# Patient Record
Sex: Male | Born: 1975 | Hispanic: No | Marital: Married | Smoking: Never smoker
Health system: Southern US, Community
[De-identification: ages and names within clinical notes are randomized; demographics above are authoritative.]

## PROBLEM LIST (undated history)

## (undated) DIAGNOSIS — M5136 Other intervertebral disc degeneration, lumbar region: Secondary | ICD-10-CM

## (undated) DIAGNOSIS — M51369 Other intervertebral disc degeneration, lumbar region without mention of lumbar back pain or lower extremity pain: Secondary | ICD-10-CM

## (undated) HISTORY — DX: Other intervertebral disc degeneration, lumbar region: M51.36

## (undated) HISTORY — DX: Other intervertebral disc degeneration, lumbar region without mention of lumbar back pain or lower extremity pain: M51.369

## (undated) HISTORY — PX: TONSILLECTOMY: SUR1361

## (undated) HISTORY — PX: KNEE ARTHROSCOPY: SHX127

---
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
## 2005-07-02 ENCOUNTER — Emergency Department (HOSPITAL_COMMUNITY): Admission: EM | Admit: 2005-07-02 | Discharge: 2005-07-02 | Payer: Self-pay | Admitting: Emergency Medicine

## 2008-05-21 ENCOUNTER — Emergency Department (HOSPITAL_COMMUNITY): Admission: EM | Admit: 2008-05-21 | Discharge: 2008-05-21 | Payer: Self-pay | Admitting: Emergency Medicine

## 2010-07-24 ENCOUNTER — Ambulatory Visit: Admit: 2010-07-24 | Payer: Self-pay | Admitting: Internal Medicine

## 2010-09-07 ENCOUNTER — Ambulatory Visit (INDEPENDENT_AMBULATORY_CARE_PROVIDER_SITE_OTHER): Payer: 59 | Admitting: Internal Medicine

## 2010-09-07 ENCOUNTER — Other Ambulatory Visit: Payer: 59

## 2010-09-07 ENCOUNTER — Encounter: Payer: Self-pay | Admitting: Internal Medicine

## 2010-09-07 ENCOUNTER — Other Ambulatory Visit: Payer: Self-pay | Admitting: Internal Medicine

## 2010-09-07 DIAGNOSIS — M5137 Other intervertebral disc degeneration, lumbosacral region: Secondary | ICD-10-CM | POA: Insufficient documentation

## 2010-09-07 DIAGNOSIS — Z0000 Encounter for general adult medical examination without abnormal findings: Secondary | ICD-10-CM

## 2010-09-07 DIAGNOSIS — E785 Hyperlipidemia, unspecified: Secondary | ICD-10-CM

## 2010-09-07 LAB — HEPATIC FUNCTION PANEL
ALT: 23 U/L (ref 0–53)
Total Bilirubin: 0.5 mg/dL (ref 0.3–1.2)
Total Protein: 7.6 g/dL (ref 6.0–8.3)

## 2010-09-07 LAB — BASIC METABOLIC PANEL
BUN: 14 mg/dL (ref 6–23)
CO2: 28 mEq/L (ref 19–32)
Chloride: 101 mEq/L (ref 96–112)
Creatinine, Ser: 0.8 mg/dL (ref 0.4–1.5)
Glucose, Bld: 85 mg/dL (ref 70–99)
Potassium: 4.3 mEq/L (ref 3.5–5.1)

## 2010-09-07 LAB — CBC WITH DIFFERENTIAL/PLATELET
Eosinophils Absolute: 0.1 10*3/uL (ref 0.0–0.7)
Eosinophils Relative: 1.6 % (ref 0.0–5.0)
HCT: 43.7 % (ref 39.0–52.0)
Lymphs Abs: 2.6 10*3/uL (ref 0.7–4.0)
MCHC: 35.2 g/dL (ref 30.0–36.0)
MCV: 88.9 fl (ref 78.0–100.0)
Monocytes Absolute: 0.8 10*3/uL (ref 0.1–1.0)
Platelets: 308 10*3/uL (ref 150.0–400.0)
WBC: 7.5 10*3/uL (ref 4.5–10.5)

## 2010-09-07 LAB — LDL CHOLESTEROL, DIRECT: Direct LDL: 102.8 mg/dL

## 2010-09-07 LAB — LIPID PANEL
Cholesterol: 178 mg/dL (ref 0–200)
VLDL: 57.4 mg/dL — ABNORMAL HIGH (ref 0.0–40.0)
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
## 2011-10-18 ENCOUNTER — Encounter: Payer: Self-pay | Admitting: Internal Medicine

## 2011-10-18 ENCOUNTER — Encounter: Payer: Self-pay | Admitting: *Deleted

## 2011-10-18 ENCOUNTER — Ambulatory Visit (INDEPENDENT_AMBULATORY_CARE_PROVIDER_SITE_OTHER): Payer: 59 | Admitting: Internal Medicine

## 2011-10-18 VITALS — BP 100/68 | HR 81 | Temp 97.1°F | Resp 16 | Wt 221.0 lb

## 2011-10-18 DIAGNOSIS — IMO0002 Reserved for concepts with insufficient information to code with codable children: Secondary | ICD-10-CM

## 2011-10-18 DIAGNOSIS — M5416 Radiculopathy, lumbar region: Secondary | ICD-10-CM

## 2011-10-18 MED ORDER — HYDROCODONE-ACETAMINOPHEN 5-500 MG PO TABS: 1.0000 | ORAL_TABLET | Freq: Three times a day (TID) | ORAL | Status: AC | PRN

## 2011-10-18 MED ORDER — PREDNISONE (PAK) 10 MG PO TABS: 10.0000 mg | ORAL_TABLET | ORAL | Status: AC
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
## 2011-10-18 NOTE — Patient Instructions (Signed)
It was good to see you today. Pred taper x 12days for treatment of inflammation around irritated disc and nerve pain - also hydrocodone if needed for severe pain spasm - Your prescription(s) have been submitted to your pharmacy. Please take as directed and contact our office if you believe you are having problem(s) with the medication(s). Continue your back exercises for physical therapy once pain symptoms have improved and ok to use tens unit as previously directed If pain symptoms worse, or if unimproved with treatment in next 2-4 weeks, call for reevaluation as discussed. Can refer to local neurosurgeon if needed

## 2011-11-09 ENCOUNTER — Encounter: Payer: Self-pay | Admitting: Internal Medicine

## 2011-11-09 ENCOUNTER — Ambulatory Visit (INDEPENDENT_AMBULATORY_CARE_PROVIDER_SITE_OTHER): Payer: 59 | Admitting: Internal Medicine

## 2011-11-09 VITALS — BP 104/60 | HR 93 | Temp 97.2°F | Resp 16 | Wt 228.0 lb

## 2011-11-09 DIAGNOSIS — M5137 Other intervertebral disc degeneration, lumbosacral region: Secondary | ICD-10-CM
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
## 2011-11-09 NOTE — Assessment & Plan Note (Signed)
Patient with several weeks of back pain that failed to improve with steroids. He has had LS spine films with loss of disc height L5-S1. He has had difficulty with urination over the past week along with acceleration in his pain. Exam is positive for radiculopathy. High degree of probability he has HNP with significant nerve compression with progressive radiculopathy.  Plan - STAT MRI lumbar spine           Recommendations based on results - looks like he may need urgent NS consult.

## 2011-11-10 ENCOUNTER — Other Ambulatory Visit: Payer: 59

## 2011-12-15 ENCOUNTER — Encounter: Payer: Self-pay | Admitting: Internal Medicine

## 2011-12-15 ENCOUNTER — Ambulatory Visit (INDEPENDENT_AMBULATORY_CARE_PROVIDER_SITE_OTHER): Payer: 59 | Admitting: Internal Medicine

## 2011-12-15 VITALS — BP 104/72 | HR 88 | Temp 98.1°F | Resp 16 | Ht 74.0 in | Wt 223.0 lb

## 2011-12-15 DIAGNOSIS — M5137 Other intervertebral disc degeneration, lumbosacral region: Secondary | ICD-10-CM

## 2011-12-15 DIAGNOSIS — Z0000 Encounter for general adult medical examination without abnormal findings: Secondary | ICD-10-CM

## 2011-12-18 DIAGNOSIS — Z0000 Encounter for general adult medical examination without abnormal findings: Secondary | ICD-10-CM | POA: Insufficient documentation

## 2011-12-18 NOTE — Assessment & Plan Note (Signed)
Mr. Frank [name] was sent by his employer to Physical therapy. He has done very well with resolution of his back pain.  Plan Continue to do regular back exercises to reduce risk of recurrent back pain.

## 2011-12-18 NOTE — Assessment & Plan Note (Signed)
Interval medical history significant for episode of backpain that has improved with PT. He has otherwise been healthy. Physical exam is normal. Labs from march '12 reviewed and except for triglycerides were normal. He is advised that there is no need for repeating labs at this interval: general labs every 3-5 years is adequate for screening. There is no general screening lab test for "cancer." Discussed ideal body weight and that he is mildly overweight with a BMI of 28.6. He is advised to continue with smart food choices, portion size control and regular exercise with a target of getting his BMI to 25.  In summary - a very nice man who is medically stable. He is encouraged to continue to make an active investment in his health: good diet, rest and plenty of exercise.  He will return in 2 -3 years for repeat general exam. He will return sooner as needed for any acute medical problems.

## 2011-12-18 NOTE — Progress Notes (Signed)
Subjective:    Patient ID: Frank [name], male    DOB: June 19, 1976, 36 y.o.   MRN: [id number]  HPI Frank [name] presents for general medical wellness exam. He has been actively investing in his health: watching his diet, trying to exercise and get adequate rest. The nature of his job makes this a challenge. He is very concerned about his lab parameters especially his cholesterol levels. He is feeling well and has no specific complaints. He has no limitations in his daily activities.  Past Medical History  Diagnosis Date  . DDD (degenerative disc disease), lumbar    Past Surgical History  Procedure Date  . Knee arthroscopy     at [place]   Family History  Problem Relation Age of Onset  . Hypertension Mother   . Depression Mother   . Cancer Maternal Grandmother     colon  . Cancer Maternal Grandfather     throat  . Diabetes Neg Hx    History   Social History  . Marital Status: Married    Spouse Name: N/A    Number of Children: N/A  . Years of Education: N/A   Occupational History  . Not on file.   Social History Main Topics  . Smoking status: Never Smoker   . Smokeless tobacco: Never Used  . Alcohol Use: No  . Drug Use: No  . Sexually Active: Yes -- Male partner(s)   Other Topics Concern  . Not on file   Social History Narrative   [employer]- BS Lobbyist. Played soccer, volleyball. Married - 2006. 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 Marriage is in good health.       Review of Systems Constitutional:  Negative for fever, chills, activity change and unexpected weight change.  HEENT:  Negative for hearing loss, ear pain, congestion, neck stiffness and postnasal drip. Negative for sore throat or swallowing problems. Negative for dental complaints.   Eyes: Negative for vision loss or change in visual acuity.  Respiratory: Negative for chest tightness and wheezing. Negative for DOE.   Cardiovascular: Negative for  chest pain or palpitations. No decreased exercise tolerance Gastrointestinal: No change in bowel habit. No bloating or gas. No reflux or indigestion Genitourinary: Negative for urgency, frequency, flank pain and difficulty urinating.  Musculoskeletal: Negative for myalgias, back pain, arthralgias and gait problem.  Neurological: Negative for dizziness, tremors, weakness and headaches.  Hematological: Negative for adenopathy.  Psychiatric/Behavioral: Negative for behavioral problems and dysphoric mood.       Objective:   Physical Exam Filed Vitals:   12/15/11 1454  BP: 104/72  Pulse: 88  Temp: 98.1 F (36.7 C)  Resp: 16  Weight: 223 lb (101.152 kg)  BMI 28.6 ( nl 19-25) Gen'l: Well nourished well developed male in no acute distress  HEENT: Head: Normocephalic and atraumatic. Right Ear: External ear normal. EAC/TM nl. Left Ear: External ear normal.  EAC/TM nl. Nose: Nose normal. Mouth/Throat: Oropharynx is clear and moist. Dentition - native, in good repair. No buccal or palatal lesions. Posterior pharynx clear. Eyes: Conjunctivae and sclera clear. EOM intact. Pupils are equal, round, and reactive to light. Right eye exhibits no discharge. Left eye exhibits no discharge. Neck: Normal range of motion. Neck supple. No JVD present. No tracheal deviation present. No thyromegaly present.  Cardiovascular: Normal rate, regular rhythm, no gallop, no friction rub, no murmur heard.      Quiet precordium. 2+ radial and DP pulses . No carotid bruits Pulmonary/Chest:  Effort normal. No respiratory distress or increased WOB, no wheezes, no rales. No chest wall deformity or CVAT. Abdominal: Soft. Bowel sounds are normal in all quadrants. He exhibits no distension, no tenderness, no rebound or guarding, No heptosplenomegaly  Genitourinary:  deferred Musculoskeletal: Normal range of motion. He exhibits no edema and no tenderness.       Small and large joints without redness, synovial thickening or  deformity. Full range of motion preserved about all small, median and large joints.  Lymphadenopathy:    He has no cervical or supraclavicular adenopathy.  Neurological: He is alert and oriented to person, place, and time. CN II-XII intact. DTRs 2+ and symmetrical biceps, radial and patellar tendons. Cerebellar function normal with no tremor, rigidity, normal gait and station.  Skin: Skin is warm and dry. No rash noted. No erythema.  Psychiatric: He has a normal mood and affect. His behavior is normal. Thought content normal.   Lab Results  Component Value Date   WBC 7.5 09/07/2010   HGB 15.4 09/07/2010   HCT 43.7 09/07/2010   PLT 308.0 09/07/2010   GLUCOSE 85 09/07/2010   [name] 178 09/07/2010   TRIG 287.0* 09/07/2010   HDL 29.70* 09/07/2010   LDLDIRECT 102.8 09/07/2010   ALT 23 09/07/2010   AST 23 09/07/2010   NA 135 09/07/2010   K 4.3 09/07/2010   CL 101 09/07/2010   CREATININE 0.8 09/07/2010   BUN 14 09/07/2010   CO2 28 09/07/2010            Assessment & Plan:

## 2013-03-28 ENCOUNTER — Encounter: Payer: 59 | Admitting: Internal Medicine

## 2013-05-22 ENCOUNTER — Encounter: Payer: Self-pay | Admitting: Internal Medicine

## 2013-05-22 ENCOUNTER — Ambulatory Visit (INDEPENDENT_AMBULATORY_CARE_PROVIDER_SITE_OTHER): Payer: 59 | Admitting: Internal Medicine

## 2013-05-22 VITALS — BP 120/70 | HR 84 | Temp 98.6°F | Ht 74.0 in | Wt 238.4 lb

## 2013-05-22 DIAGNOSIS — F411 Generalized anxiety disorder: Secondary | ICD-10-CM

## 2013-05-22 DIAGNOSIS — Z23 Encounter for immunization: Secondary | ICD-10-CM

## 2013-05-22 DIAGNOSIS — M76899 Other specified enthesopathies of unspecified lower limb, excluding foot: Secondary | ICD-10-CM

## 2013-05-22 DIAGNOSIS — M7062 Trochanteric bursitis, left hip: Secondary | ICD-10-CM

## 2013-05-22 DIAGNOSIS — M5137 Other intervertebral disc degeneration, lumbosacral region: Secondary | ICD-10-CM

## 2013-05-22 DIAGNOSIS — Z0000 Encounter for general adult medical examination without abnormal findings: Secondary | ICD-10-CM

## 2013-05-22 NOTE — Patient Instructions (Signed)
Thanks for coming in to see me.  Over all you appear to be very healthy.  Pain at the left hip - good Range of motion. There is tenderness to pressure against the greater trochanter - this is strongly suggestive of trochanteric bursitis (see below). Plan Rub of choice (BenGay, Aspercreme, etc)_, heat and short term use of over the counter anti-inflammatory medication, e.g. Aleve or Ibuprofen.  Stress and relationship - it is stressful to have 3 children for both of you. I strongly recommended couples counseling/couples work. Google "Marriage Encounter" as a possible resource, check out the availability of faith based counseling or consider [facility] for counseling - [phone] to call for an appointment with one of our staff. Marriage takes a lot of work but is definitely work it.  Routine labs today - results will be posted to MyChart.  You should have a repeat general exam in 2-3 years but may return sooner for problems.    Trochanteric Bursitis You have hip pain due to trochanteric bursitis. Bursitis means that the sack near the outside of the hip is filled with fluid and inflamed. This sack is made up of protective soft tissue. The pain from trochanteric bursitis can be severe and keep you from sleep. It can radiate to the buttocks or down the outside of the thigh to the knee. The pain is almost always worse when rising from the seated or lying position and with walking. Pain can improve after you take a few steps. It happens more often in people with hip joint and lumbar spine problems, such as arthritis or previous surgery. Very rarely the trochanteric bursa can become infected, and antibiotics and/or surgery may be needed. Treatment often includes an injection of local anesthetic mixed with cortisone medicine. This medicine is injected into the area where it is most tender over the hip. Repeat injections may be necessary if the response to treatment is slow. You can apply ice  packs over the tender area for 30 minutes every 2 hours for the next few days. Anti-inflammatory and/or narcotic pain medicine may also be helpful. Limit your activity for the next few days if the pain continues. See your caregiver in 5-10 days if you are not greatly improved.  SEEK IMMEDIATE MEDICAL CARE IF:  You develop severe pain, fever, or increased redness.  You have pain that radiates below the knee. EXERCISES STRETCHING EXERCISES - Trochantic Bursitis  These exercises may help you when beginning to rehabilitate your injury. Your symptoms may resolve with or without further involvement from your physician, physical therapist or athletic trainer. While completing these exercises, remember:   Restoring tissue flexibility helps normal motion to return to the joints. This allows healthier, less painful movement and activity.  An effective stretch should be held for at least 30 seconds.  A stretch should never be painful. You should only feel a gentle lengthening or release in the stretched tissue. STRETCH  Iliotibial Band  On the floor or bed, lie on your side so your injured leg is on top. Bend your knee and grab your ankle.  Slowly bring your knee back so that your thigh is in line with your trunk. Keep your heel at your buttocks and gently arch your back so your head, shoulders and hips line up.  Slowly lower your leg so that your knee approaches the floor/bed until you feel a gentle stretch on the outside of your thigh. If you do not feel a stretch and your knee will not  fall farther, place the heel of your opposite foot on top of your knee and pull your thigh down farther.  Hold this stretch for __________ seconds.  Repeat __________ times. Complete this exercise __________ times per day. STRETCH Hamstrings, Supine   Lie on your back. Loop a belt or towel over the ball of your foot as shown.  Straighten your knee and slowly pull on the belt to raise your injured leg. Do not  allow the knee to bend. Keep your opposite leg flat on the floor.  Raise the leg until you feel a gentle stretch behind your knee or thigh. Hold this position for __________ seconds.  Repeat __________ times. Complete this stretch __________ times per day. STRETCH - Quadriceps, Prone   Lie on your stomach on a firm surface, such as a bed or padded floor.  Bend your knee and grasp your ankle. If you are unable to reach, your ankle or pant leg, use a belt around your foot to lengthen your reach.  Gently pull your heel toward your buttocks. Your knee should not slide out to the side. You should feel a stretch in the front of your thigh and/or knee.  Hold this position for __________ seconds.  Repeat __________ times. Complete this stretch __________ times per day. STRETCHING - Hip Flexors, Lunge Half kneel with your knee on the floor and your opposite knee bent and directly over your ankle.  Keep good posture with your head over your shoulders. Tighten your buttocks to point your tailbone downward; this will prevent your back from arching too much.  You should feel a gentle stretch in the front of your thigh and/or hip. If you do not feel any resistance, slightly slide your opposite foot forward and then slowly lunge forward so your knee once again lines up over your ankle. Be sure your tailbone remains pointed downward.  Hold this stretch for __________ seconds.  Repeat __________ times. Complete this stretch __________ times per day. STRETCH - Adductors, Lunge  While standing, spread your legs  Lean away from your injured leg by bending your opposite knee. You may rest your hands on your thigh for balance.  You should feel a stretch in your inner thigh. Hold for __________ seconds.  Repeat __________ times. Complete this exercise __________ times per day. Document Released: 07/29/2004 Document Revised: 09/13/2011 Document Reviewed: 10/03/2008 [facility] Patient Information 2014  [place], [place].

## 2013-05-22 NOTE — Progress Notes (Signed)
Subjective:    Patient ID: Frank [name], male    DOB: March 21, 1976, 37 y.o.   MRN: [id number]  HPI Mr. Varelas presents for a routine wellness exam. He has been doing well. He has now got 3 children under a year, 2 years and 4 years. He reports that there is some stress - he feels is wife is having a change in mood. He has had some symptoms usually after an argument.  He has had minimal back trouble. He reports a month of pain in the left thigh, trochanter area.   Past Medical History  Diagnosis Date  . DDD (degenerative disc disease), lumbar    Past Surgical History  Procedure Laterality Date  . Knee arthroscopy      at [place]   Family History  Problem Relation Age of Onset  . Hypertension Mother   . Depression Mother   . Cancer Maternal Grandmother     colon  . Cancer Maternal Grandfather     throat  . Diabetes Neg Hx    History   Social History  . Marital Status: Married    Spouse Name: N/A    Number of Children: N/A  . Years of Education: N/A   Occupational History  . Not on file.   Social History Main Topics  . Smoking status: Never Smoker   . Smokeless tobacco: Never Used  . Alcohol Use: Yes     Comment: occ  . Drug Use: No  . Sexual Activity: Yes    Partners: Female   Other Topics Concern  . Not on file   Social History Narrative   [employer]- BS Lobbyist. Played soccer, volleyball. Married - 2006. 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 Marriage is in good health.    No current outpatient prescriptions on file prior to visit.   No current facility-administered medications on file prior to visit.      Review of Systems Constitutional:  Negative for fever, chills, activity change and unexpected weight change.  HEENT:  Negative for hearing loss, ear pain, congestion, neck stiffness and postnasal drip. Negative for sore throat or swallowing problems. Negative for dental complaints.   Eyes: Negative for  vision loss or change in visual acuity.  Respiratory: Negative for chest tightness and wheezing. Negative for DOE.   Cardiovascular: Negative for chest pain or palpitations. No decreased exercise tolerance Gastrointestinal: No change in bowel habit. No bloating or gas. No reflux or indigestion Genitourinary: Negative for urgency, frequency, flank pain and difficulty urinating.  Musculoskeletal: Negative for myalgias, back pain, arthralgias and gait problem.  Neurological: Negative for dizziness, tremors, weakness and headaches.  Hematological: Negative for adenopathy.  Psychiatric/Behavioral: Negative for behavioral problems and dysphoric mood.       Objective:   Physical Exam Filed Vitals:   05/22/13 0923  BP: 120/70  Pulse: 84  Temp: 98.6 F (37 C)   Wt Readings from Last 3 Encounters:  05/22/13 238 lb 6.4 oz (108.138 kg)  12/15/11 223 lb (101.152 kg)  11/09/11 228 lb (103.42 kg)   Gen'l: Well nourished well developed     male in no acute distress  HEENT: Head: Normocephalic and atraumatic. Right Ear: External ear normal. EAC/TM nl. Left Ear: External ear normal.  EAC/TM nl. Nose: Nose normal. Mouth/Throat: Oropharynx is clear and moist. Dentition - native, in good repair. No buccal or palatal lesions. Posterior pharynx clear. Eyes: Conjunctivae and sclera clear. EOM intact. Pupils are equal, round, and  reactive to light. Right eye exhibits no discharge. Left eye exhibits no discharge. Neck: Normal range of motion. Neck supple. No JVD present. No tracheal deviation present. No thyromegaly present.  Cardiovascular: Normal rate, regular rhythm, no gallop, no friction rub, no murmur heard.      Quiet precordium. 2+ radial and DP pulses . No carotid bruits Pulmonary/Chest: Effort normal. No respiratory distress or increased WOB, no wheezes, no rales. No chest wall deformity or CVAT. Abdomen: Soft. Bowel sounds are normal in all quadrants. He exhibits no distension, no tenderness, no  rebound or guarding, No heptosplenomegaly  Genitourinary:  deferred to young age Musculoskeletal: Normal range of motion. He exhibits no edema and no tenderness.       Small and large joints without redness, synovial thickening or deformity. Full range of motion preserved about all small, median and large joints. Normal ROM left hip but tender with internal and external rotation at the lateral hip. Tender to pressure against the greater trochanter left.  Lymphadenopathy:    He has no cervical or supraclavicular adenopathy.  Neurological: He is alert and oriented to person, place, and time. CN II-XII intact. DTRs 2+ and symmetrical biceps, radial and patellar tendons. Cerebellar function normal with no tremor, rigidity, normal gait and station.  Skin: Skin is warm and dry. No rash noted. No erythema.  Psychiatric: He has a normal mood and affect. His behavior is normal. Thought content normal.           Assessment & Plan:

## 2013-05-22 NOTE — Progress Notes (Signed)
Pre visit review using our clinic review tool, if applicable. No additional management support is needed unless otherwise documented below in the visit note. 

## 2013-05-23 DIAGNOSIS — M7062 Trochanteric bursitis, left hip: Secondary | ICD-10-CM | POA: Insufficient documentation

## 2013-05-23 DIAGNOSIS — F411 Generalized anxiety disorder: Secondary | ICD-10-CM | POA: Insufficient documentation

## 2013-05-23 NOTE — Assessment & Plan Note (Signed)
Stable w/o complaint of back pain. He does do daily back exercise.

## 2013-05-23 NOTE — Assessment & Plan Note (Signed)
Three children under the age of 52. Some strain on the primary relationship with physical symptoms associated with discord: chest pain, arm pain, depressed mood.  Plan Strongly urged couples counseling - resources provided.

## 2013-05-23 NOTE — Assessment & Plan Note (Signed)
Interval history w/o major illness, surgery or injury. Physical exam is normal. Routine lab ordered and pending. Current with immunization.  Insummary - a healthy man with not unusual stress of a growing family. He will return as needed with recommendation of a full physical in 2-3 years.

## 2013-05-23 NOTE — Assessment & Plan Note (Signed)
C/o left hip pain with tenderness on exam at the left trochanteric bursa.  Plan Stretch  NSAID  Heat  If no relief - referral to Sports Medicine.

## 2013-05-25 ENCOUNTER — Telehealth: Payer: Self-pay | Admitting: Internal Medicine

## 2013-05-25 ENCOUNTER — Encounter: Payer: Self-pay | Admitting: Family Medicine

## 2013-05-25 ENCOUNTER — Other Ambulatory Visit (INDEPENDENT_AMBULATORY_CARE_PROVIDER_SITE_OTHER): Payer: 59

## 2013-05-25 ENCOUNTER — Ambulatory Visit (INDEPENDENT_AMBULATORY_CARE_PROVIDER_SITE_OTHER): Payer: 59 | Admitting: Family Medicine

## 2013-05-25 VITALS — BP 118/72 | HR 118 | Temp 100.9°F | Wt 237.0 lb

## 2013-05-25 DIAGNOSIS — J069 Acute upper respiratory infection, unspecified: Secondary | ICD-10-CM

## 2013-05-25 DIAGNOSIS — Z0000 Encounter for general adult medical examination without abnormal findings: Secondary | ICD-10-CM

## 2013-05-25 DIAGNOSIS — R509 Fever, unspecified: Secondary | ICD-10-CM

## 2013-05-25 LAB — LIPID PANEL
LDL Cholesterol: 87 mg/dL (ref 0–99)
VLDL: 32.8 mg/dL (ref 0.0–40.0)

## 2013-05-25 LAB — COMPREHENSIVE METABOLIC PANEL
ALT: 55 U/L — ABNORMAL HIGH (ref 0–53)
Alkaline Phosphatase: 67 U/L (ref 39–117)
CO2: 24 mEq/L (ref 19–32)
Potassium: 3.6 mEq/L (ref 3.5–5.1)
Sodium: 134 mEq/L — ABNORMAL LOW (ref 135–145)
Total Bilirubin: 0.2 mg/dL — ABNORMAL LOW (ref 0.3–1.2)
Total Protein: 7 g/dL (ref 6.0–8.3)

## 2013-05-25 LAB — SEDIMENTATION RATE: Sed Rate: 16 mm/hr (ref 0–22)

## 2013-05-25 MED ORDER — AMOXICILLIN-POT CLAVULANATE 875-125 MG PO TABS: 1.0000 | ORAL_TABLET | Freq: Two times a day (BID) | ORAL | Status: DC

## 2013-05-25 NOTE — Progress Notes (Signed)
SUBJECTIVE:  Frank [name] is a 37 y.o. male who complains of coryza, sore throat, swollen glands, myalgias, fever and chills for 3 days. He denies a history of chest pain, dizziness, nausea, vomiting, weight loss, sputum production and does have + sick contact in daughter. and denies a history of asthma. Patient denies smoke cigarettes.   OBJECTIVE: Blood pressure 118/72, pulse 118, temperature 100.9 F (38.3 C), temperature source Oral, weight 237 lb (107.502 kg), SpO2 95.00%.  He appears well, vital signs are as noted. Ears normal.  Throat and pharynx erythema.  Neck supple. Mild adenopathy in the neck. Nose is congested. Sinuses non tender. The chest is clear, without wheezes or rales.  RRR no murmur Strep and flu negative.   ASSESSMENT:  viral upper respiratory illness  PLAN: Symptomatic therapy suggested: push fluids, rest and return office visit prn if symptoms persist or worsen. Lack of antibiotic effectiveness discussed with him but was given a rx due to traveling over the weekend. Wait and see.  Call or return to clinic prn if these symptoms worsen or fail to improve as anticipated.

## 2013-05-25 NOTE — Telephone Encounter (Signed)
Patient Information:  Caller Name: Brevin  Phone: [phone]  Patient: Frank [name], Frank [name]  Gender: Male  DOB: [birth year]  Age: 37 Years  PCP: Illene Regulus (Adults only)  Office Follow Up:  Does the office need to follow up with this patient?: No  Instructions For The Office: N/A  RN Note:  No injection site redness. Had 600 mg Ibuprofen at 1445. Hydrate and rest.  Advised to stay home (not travel) while has fever.    Symptoms  Reason For Call & Symptoms: Received flu shot 05/23/13.  Concerned about flu-like sympotms with headache, chills, body aches, nausea, sore throat and malaise/fatigue.  Is supposed to travel to [place] and [place] 05/26/13.  Reviewed Health History In EMR: Yes  Reviewed Medications In EMR: Yes  Reviewed Allergies In EMR: Yes  Reviewed Surgeries / Procedures: Yes  Date of Onset of Symptoms: 05/24/2013  Treatments Tried: Advil BID  Treatments Tried Worked: Yes  Any Fever: Yes  Fever Taken: Oral  Fever Time Of Reading: 15:30:00  Fever Last Reading: 101  Guideline(s) Used:  Influenza - Seasonal  Immunization Reactions  Disposition Per Guideline:   See Today or Tomorrow in Office  Reason For Disposition Reached:   Patient wants to be seen  Advice Given:  Pain and Fever Medicines:  For pain or fever relief, take either acetaminophen or ibuprofen.  They are over-the-counter (OTC) drugs that help treat both fever and pain. You can buy them at the drugstore.  Treat fevers above 101 F (38.3 C). The goal of fever therapy is to bring the fever down to a comfortable level. Remember that fever medicine usually lowers fever 2 degrees F (1 - 1 1/2 degrees C).  Acetaminophen (e.g., Tylenol):  Regular Strength Tylenol: Take 650 mg (two 325 mg pills) by mouth every 4-6 hours as needed. Each Regular Strength Tylenol pill has 325 mg of acetaminophen.  Extra Strength Tylenol: Take 1,000 mg (two 500 mg pills) every 8 hours as needed. Each Extra Strength Tylenol pill  has 500 mg of acetaminophen.  Ibuprofen (e.g., Motrin, Advil):  Another choice is to take 600 mg (three 200 mg pills) by mouth every 8 hours.  The most you should take each day is 1,200 mg (six 200 mg pills), unless your doctor has told you to take more.  Extra Notes :  Use the lowest amount of medicine that makes your fever better.  Call Back If:  Fever lasts more than 3 days  Injection site starts to look infected  You become worse.  Patient Will Follow Care Advice:  YES  Appointment Scheduled:  05/25/2013 16:15:00 Appointment Scheduled Provider:  Terrilee Files

## 2013-05-25 NOTE — Patient Instructions (Signed)
Nice to meet you You have a virus. Ibuprofen or tylenol every four hours for your virus.  I am giving you a medication to only fill if you feel you are not getting better.  Keep hydrated.  Read the instructions below Come back if worsen   Upper Respiratory Infection, Adult An upper respiratory infection (URI) is also known as the common cold. It is often caused by a type of germ (virus). Colds are easily spread (contagious). You can pass it to others by kissing, coughing, sneezing, or drinking out of the same glass. Usually, you get better in 1 or 2 weeks.  HOME CARE   Only take medicine as told by your doctor.  Use a warm mist humidifier or breathe in steam from a hot shower.  Drink enough water and fluids to keep your pee (urine) clear or pale yellow.  Get plenty of rest.  Return to work when your temperature is back to normal or as told by your doctor. You may use a face mask and wash your hands to stop your cold from spreading. GET HELP RIGHT AWAY IF:   After the first few days, you feel you are getting worse.  You have questions about your medicine.  You have chills, shortness of breath, or brown or red spit (mucus).  You have yellow or brown snot (nasal discharge) or pain in the face, especially when you bend forward.  You have a fever, puffy (swollen) neck, pain when you swallow, or white spots in the back of your throat.  You have a bad headache, ear pain, sinus pain, or chest pain.  You have a high-pitched whistling sound when you breathe in and out (wheezing).  You have a lasting cough or cough up blood.  You have sore muscles or a stiff neck. MAKE SURE YOU:   Understand these instructions.  Will watch your condition.  Will get help right away if you are not doing well or get worse. Document Released: 12/08/2007 Document Revised: 09/13/2011 Document Reviewed: 10/26/2010 [facility] Patient Information 2014 [place], [place].    Hand, Foot, and Mouth  Disease Hand, foot, and mouth disease is a common viral illness. It occurs mainly in children younger than 41 years of age, but adolescents and adults may also get it. This disease is different than foot and mouth disease that cattle, sheep, and pigs get. Most people are better in 1 week. CAUSES  Hand, foot, and mouth disease is usually caused by a group of viruses called enteroviruses. Hand, foot, and mouth disease can spread from person to person (contagious). A person is most contagious during the first week of the illness. It is not transmitted to or from pets or other animals. It is most common in the summer and early fall. Infection is spread from person to person by direct contact with an infected person's:  Nose discharge.  Throat discharge.  Stool. SYMPTOMS  Open sores (ulcers) occur in the mouth. Symptoms may also include:  A rash on the hands and feet, and occasionally the buttocks.  Fever.  Aches.  Pain from the mouth ulcers.  Fussiness. DIAGNOSIS  Hand, foot, and mouth disease is one of many infections that cause mouth sores. To be certain your child has hand, foot, and mouth disease your caregiver will diagnose your child by physical exam.Additional tests are not usually needed. TREATMENT  Nearly all patients recover without medical treatment in 7 to 10 days. There are no common complications. Your child should only take over-the-counter  or prescription medicines for pain, discomfort, or fever as directed by your caregiver. Your caregiver may recommend the use of an over-the-counter antacid or a combination of an antacid and diphenhydramine to help coat the lesions in the mouth and improve symptoms.  HOME CARE INSTRUCTIONS  Try combinations of foods to see what your child will tolerate and aim for a balanced diet. Soft foods may be easier to swallow. The mouth sores from hand, foot, and mouth disease typically hurt and are painful when exposed to salty, spicy, or acidic  food or drinks.  Milk and cold drinks are soothing for some patients. Milk shakes, frozen ice pops, slushies, and sherberts are usually well tolerated.  Sport drinks are good choices for hydration, and they also provide a few calories. Often, a child with hand, foot, and mouth disease will be able to drink without discomfort.   For younger children and infants, feeding with a cup, spoon, or syringe may be less painful than drinking through the nipple of a bottle.  Keep children out of childcare programs, schools, or other group settings during the first few days of the illness or until they are without fever. The sores on the body are not contagious. SEEK IMMEDIATE MEDICAL CARE IF:  Your child develops signs of dehydration such as:  Decreased urination.  Dry mouth, tongue, or lips.  Decreased tears or sunken eyes.  Dry skin.  Rapid breathing.  Fussy behavior.  Poor color or pale skin.  Fingertips taking longer than 2 seconds to turn pink after a gentle squeeze.  Rapid weight loss.  Your child does not have adequate pain relief.  Your child develops a severe headache, stiff neck, or change in behavior.  Your child develops ulcers or blisters that occur on the lips or outside of the mouth. Document Released: 03/20/2003 Document Revised: 09/13/2011 Document Reviewed: 12/03/2010 [facility] Patient Information 2014 [place], [place].

## 2013-06-01 ENCOUNTER — Encounter: Payer: Self-pay | Admitting: Internal Medicine

## 2014-01-03 ENCOUNTER — Other Ambulatory Visit: Payer: Self-pay

## 2014-01-03 ENCOUNTER — Emergency Department (HOSPITAL_COMMUNITY): Payer: 59

## 2014-01-03 ENCOUNTER — Telehealth: Payer: Self-pay | Admitting: Family Medicine

## 2014-01-03 ENCOUNTER — Encounter (HOSPITAL_COMMUNITY): Payer: Self-pay | Admitting: Emergency Medicine

## 2014-01-03 ENCOUNTER — Emergency Department (HOSPITAL_COMMUNITY)
Admission: EM | Admit: 2014-01-03 | Discharge: 2014-01-03 | Disposition: A | Discharge status: Home / Self Care | Payer: 59 | Attending: Emergency Medicine | Admitting: Emergency Medicine

## 2014-01-03 DIAGNOSIS — R079 Chest pain, unspecified: Secondary | ICD-10-CM

## 2014-01-03 DIAGNOSIS — Z8739 Personal history of other diseases of the musculoskeletal system and connective tissue: Secondary | ICD-10-CM | POA: Insufficient documentation

## 2014-01-03 LAB — BASIC METABOLIC PANEL
Anion gap: 16 — ABNORMAL HIGH (ref 5–15)
BUN: 12 mg/dL (ref 6–23)
CALCIUM: 9.3 mg/dL (ref 8.4–10.5)
CO2: 24 meq/L (ref 19–32)
Chloride: 97 mEq/L (ref 96–112)
Creatinine, Ser: 0.81 mg/dL (ref 0.50–1.35)
GFR calc Af Amer: >90 mL/min (ref >90)
GLUCOSE: 99 mg/dL (ref 70–99)
Potassium: 4.1 mEq/L (ref 3.7–5.3)
Sodium: 137 mEq/L (ref 137–147)

## 2014-01-03 LAB — CBC
HEMATOCRIT: 41 % (ref 39.0–52.0)
HEMOGLOBIN: 14.5 g/dL (ref 13.0–17.0)
MCH: 30.1 pg (ref 26.0–34.0)
MCHC: 35.4 g/dL (ref 30.0–36.0)
MCV: 85.1 fL (ref 78.0–100.0)
Platelets: 317 10*3/uL (ref 150–400)
RBC: 4.82 MIL/uL (ref 4.22–5.81)
RDW: 12.4 % (ref 11.5–15.5)
WBC: 8.4 10*3/uL (ref 4.0–10.5)

## 2014-01-03 LAB — I-STAT TROPONIN, ED: Troponin i, poc: 0 ng/mL (ref 0.00–0.08)

## 2014-01-03 NOTE — ED Provider Notes (Signed)
38 year old male has been having episodes of midsternal chest discomfort for the last 2-3 days. Pain will be present for at most, 1-2 minutes before resolving. There is no associated dyspnea, nausea, diaphoresis. Symptoms are not exertional or positional. His only cardiac risk factor is that he has an uncle who had an MI at age 37. He is a nonsmoker without history of diabetes, hyperlipidemia, hypertension. ED workup is unremarkable. Patient is reassured, but because of family history of CAD, he is referred to [facility] for outpatient stress testing.   Date: 01/03/2014  Rate: 97  Rhythm: normal sinus rhythm  QRS Axis: normal  Intervals: normal  ST/T Wave abnormalities: early repolarization  Conduction Disutrbances:none  Narrative Interpretation: Early repolarization. No prior ECGs available for comparison.  Old EKG Reviewed: none available  I saw and evaluated the patient, reviewed the resident's note and I agree with the findings and plan.    Dione Boozeavid Tarrence Enck, MD 01/03/14 Windell [id number]

## 2014-01-03 NOTE — Discharge Instructions (Signed)

## 2014-01-03 NOTE — ED Notes (Signed)
Pt reports mid chest pain x 2-3 days. Pain increases when taking a deep breath but denies sob or cough. Having numbness/tingling to left arm and hand. ekg done at triage, airway intact.

## 2014-01-03 NOTE — Telephone Encounter (Signed)
Patient Information:  Caller Name: Frank [name]  Phone: [phone]  Patient: Frank [name], Frank [name]  Gender: Male  DOB: 1975-07-28  Age: 38 Years  PCP: Other  Office Follow Up:  Does the office need to follow up with this patient?: No  Instructions For The Office: N/A  RN Note:  Pt. will be instructed to go to the ED at [facility] now.  Symptoms  Reason For Call & Symptoms: Complaining of chest pain and left arm is numb. Also having some left shoulder pain. Not SOB now. Onset 3 days ago. Pain has become more constant and feels like pressure now.  Reviewed Health History In EMR: Yes  Reviewed Medications In EMR: Yes  Reviewed Allergies In EMR: Yes  Reviewed Surgeries / Procedures: Yes  Date of Onset of Symptoms: 12/31/2013  Guideline(s) Used:  Chest Pain  Disposition Per Guideline:   Go to ED Now  Reason For Disposition Reached:   Pain also present in shoulder(s) or arm(s) or jaw  Advice Given:  Call Back If:  You become worse.  Patient Will Follow Care Advice:  YES

## 2014-01-03 NOTE — ED Provider Notes (Signed)
CSN: [id number]     Arrival date & time 01/03/14  1650 History   First MD Initiated Contact with Patient 01/03/14 1746     Chief Complaint  Patient presents with  . Chest Pain     (Consider location/radiation/quality/duration/timing/severity/associated sxs/prior Treatment) Patient is a 38 y.o. male presenting with chest pain.  Chest Pain Pain location:  L chest Pain quality: sharp   Pain radiates to:  Does not radiate Pain radiates to the back: no   Pain severity:  Moderate Onset quality:  Gradual Timing:  Constant Chronicity:  Recurrent Relieved by:  None tried Associated symptoms: anxiety   Associated symptoms: no abdominal pain, no back pain, no cough, no fever, no headache, no nausea, no near-syncope, no shortness of breath and not vomiting   Risk factors: no aortic disease, no coronary artery disease, no diabetes mellitus, no high cholesterol and no hypertension     Past Medical History  Diagnosis Date  . DDD (degenerative disc disease), lumbar    Past Surgical History  Procedure Laterality Date  . Knee arthroscopy      at [facility]   Family History  Problem Relation Age of Onset  . Hypertension Mother   . Depression Mother   . Cancer Maternal Grandmother     colon  . Cancer Maternal Grandfather     throat  . Diabetes Neg Hx    History  Substance Use Topics  . Smoking status: Never Smoker   . Smokeless tobacco: Never Used  . Alcohol Use: Yes     Comment: occ    Review of Systems  Constitutional: Negative for fever and chills.  HENT: Negative for sore throat.   Eyes: Negative for pain.  Respiratory: Negative for cough and shortness of breath.   Cardiovascular: Positive for chest pain. Negative for near-syncope.  Gastrointestinal: Negative for nausea, vomiting and abdominal pain.  Genitourinary: Negative for dysuria and flank pain.  Musculoskeletal: Negative for back pain and neck pain.  Skin: Negative for rash.  Neurological: Negative for seizures and  headaches.      Allergies  Garlic  Home Medications   Prior to Admission medications   Not on File   BP 124/78  Pulse 84  Temp(Src) 98.3 F (36.8 C) (Oral)  Resp 13  SpO2 99% Physical Exam  Constitutional: He is oriented to person, place, and time. He appears well-developed and well-nourished. No distress.  HENT:  Head: Normocephalic and atraumatic.  Eyes: Pupils are equal, round, and reactive to light.  Neck: Normal range of motion.  Cardiovascular: Normal rate and regular rhythm.   Pulmonary/Chest: Effort normal and breath sounds normal.  Abdominal: Soft. He exhibits no distension. There is no tenderness.  Musculoskeletal: Normal range of motion.  Neurological: He is alert and oriented to person, place, and time.  Skin: Skin is warm. He is not diaphoretic.    ED Course  Procedures (including critical care time) Labs Review Labs Reviewed  BASIC METABOLIC PANEL - Abnormal; Notable for the following:    Anion gap 16 (*)    All other components within normal limits  CBC  I-STAT TROPOININ, ED    Imaging Review Dg Chest 2 View  01/03/2014   CLINICAL DATA:  Chest pain and shortness of Breath.  EXAM: CHEST  2 VIEW  COMPARISON:  None.  FINDINGS: The cardiac silhouette, mediastinal and hilar contours are within normal limits. The lungs are clear. No pleural effusion. The bony thorax is intact.  IMPRESSION: No acute cardiopulmonary findings.  Electronically Signed   By: Loralie ChampagneMark  Gallerani M.D.   On: 01/03/2014 17:18     EKG Interpretation None      MDM   Final diagnoses:  Chest pain, unspecified chest pain type   38 yo M with no sig PMHx presents with sharp, left-sided chest pain.   Patient with multiple stressors recently. Likely anxiety related. Labs demonstrate no concerning findings at this time. iStat troponin negative. CXR negative for acute findings. EKG demonstrates early repol pattern with concerning findings. PERC negative. Doubt ACS,  pericarditis/myocarditis, pneumonitis, PNA, dissection. Will d/c with plans to follow-up with PCP. Discharged in stable condition. Patient seen and evaluated by myself and my attending, Dr. Preston FleetingGlick.      Imagene ShellerSteve Gissell Barra, MD 01/04/14 1044

## 2014-03-07 ENCOUNTER — Ambulatory Visit: Payer: 59 | Admitting: Internal Medicine

## 2014-03-07 DIAGNOSIS — R4689 Other symptoms and signs involving appearance and behavior: Secondary | ICD-10-CM | POA: Insufficient documentation

## 2014-03-08 ENCOUNTER — Encounter: Payer: Self-pay | Admitting: Internal Medicine

## 2014-03-08 ENCOUNTER — Ambulatory Visit (INDEPENDENT_AMBULATORY_CARE_PROVIDER_SITE_OTHER): Payer: 59 | Admitting: Internal Medicine

## 2014-03-08 VITALS — BP 100/70 | HR 81 | Temp 98.2°F | Ht 74.0 in | Wt 242.1 lb

## 2014-03-08 DIAGNOSIS — M5416 Radiculopathy, lumbar region: Secondary | ICD-10-CM

## 2014-03-08 DIAGNOSIS — IMO0002 Reserved for concepts with insufficient information to code with codable children: Secondary | ICD-10-CM

## 2014-03-08 MED ORDER — HYDROCODONE-ACETAMINOPHEN 5-325 MG PO TABS: 1.0000 | ORAL_TABLET | Freq: Four times a day (QID) | ORAL | Status: DC | PRN

## 2014-03-08 MED ORDER — PREDNISONE 10 MG PO TABS: 10.0000 mg | ORAL_TABLET | Freq: Every day | ORAL | Status: DC

## 2014-03-08 MED ORDER — TIZANIDINE HCL 4 MG PO TABS: 4.0000 mg | ORAL_TABLET | Freq: Four times a day (QID) | ORAL | Status: DC | PRN

## 2014-03-08 NOTE — Progress Notes (Signed)
Pre visit review using our clinic review tool, if applicable. No additional management support is needed unless otherwise documented below in the visit note. 

## 2014-03-08 NOTE — Patient Instructions (Signed)
Please take all new medication as prescribed  Please continue all other medications as before, and refills have been done if requested.  Please have the pharmacy call with any other refills you may need.  Please keep your appointments with your specialists as you may have planned  Good Luck on your trip; please call for orthopedic referral on your return if not improved
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
## 2014-03-09 NOTE — Assessment & Plan Note (Signed)
With mild neuro change sensory only, going out of country soon, for pain control, muscle relaxer, predpac asd,  to f/u any worsening symptoms or concerns

## 2014-12-12 ENCOUNTER — Ambulatory Visit (INDEPENDENT_AMBULATORY_CARE_PROVIDER_SITE_OTHER)
Admission: RE | Admit: 2014-12-12 | Discharge: 2014-12-12 | Disposition: A | Discharge status: Home / Self Care | Payer: 59 | Source: Ambulatory Visit | Attending: Family | Admitting: Family

## 2014-12-12 ENCOUNTER — Encounter: Payer: Self-pay | Admitting: Family

## 2014-12-12 ENCOUNTER — Ambulatory Visit (INDEPENDENT_AMBULATORY_CARE_PROVIDER_SITE_OTHER): Payer: 59 | Admitting: Family

## 2014-12-12 VITALS — BP 102/80 | HR 92 | Temp 98.3°F | Resp 18 | Ht 74.0 in | Wt 240.8 lb

## 2014-12-12 DIAGNOSIS — M79671 Pain in right foot: Secondary | ICD-10-CM

## 2014-12-12 DIAGNOSIS — R079 Chest pain, unspecified: Secondary | ICD-10-CM

## 2014-12-12 NOTE — Assessment & Plan Note (Signed)
Symptom of chest pain of undetermined origin. In office EKG reveals normal sinus rhythm. Unlikely cardiac origin and cannot rule out anxiety as the mechanism of his symptoms. Refer to cardiology for stress test to rule out cardiac involvement. Continue to monitor at this time as patient is not interested in trying medications. Seek further emergency care if symptoms worsen or fail to improve with relaxation. Follow up pending cardiology appointment.

## 2014-12-12 NOTE — Progress Notes (Signed)
Pre visit review using our clinic review tool, if applicable. No additional management support is needed unless otherwise documented below in the visit note. 

## 2014-12-12 NOTE — Patient Instructions (Signed)
Thank you for choosing [facility].  Summary/Instructions:  Please stop by radiology on the basement level of the building for your x-rays. Your results will be released to MyChart (or called to you) after review, usually within 72 hours after test completion. If any treatments or changes are necessary, you will be notified at that same time.  Referrals have been made during this visit. You should expect to hear back from our schedulers in about 7-10 days in regards to establishing an appointment with the specialists we discussed.   If your symptoms worsen or fail to improve, please contact our office for further instruction, or in case of emergency go directly to the emergency room at the closest medical facility.    Please ice/heat your foot 2-3 times per day and wear a supportive shoe.   Foot Sprain The muscles and cord like structures which attach muscle to bone (tendons) that surround the feet are made up of units. A foot sprain can occur at the weakest spot in any of these units. This condition is most often caused by injury to or overuse of the foot, as from playing contact sports, or aggravating a previous injury, or from poor conditioning, or obesity. SYMPTOMS  Pain with movement of the foot.  Tenderness and swelling at the injury site.  Loss of strength is present in moderate or severe sprains. THE THREE GRADES OR SEVERITY OF FOOT SPRAIN ARE:  Mild (Grade I): Slightly pulled muscle without tearing of muscle or tendon fibers or loss of strength.  Moderate (Grade II): Tearing of fibers in a muscle, tendon, or at the attachment to bone, with small decrease in strength.  Severe (Grade III): Rupture of the muscle-tendon-bone attachment, with separation of fibers. Severe sprain requires surgical repair. Often repeating (chronic) sprains are caused by overuse. Sudden (acute) sprains are caused by direct injury or over-use. DIAGNOSIS  Diagnosis of this condition is usually by  your own observation. If problems continue, a caregiver may be required for further evaluation and treatment. X-rays may be required to make sure there are not breaks in the bones (fractures) present. Continued problems may require physical therapy for treatment. PREVENTION  Use strength and conditioning exercises appropriate for your sport.  Warm up properly prior to working out.  Use athletic shoes that are made for the sport you are participating in.  Allow adequate time for healing. Early return to activities makes repeat injury more likely, and can lead to an unstable arthritic foot that can result in prolonged disability. Mild sprains generally heal in 3 to 10 days, with moderate and severe sprains taking 2 to 10 weeks. Your caregiver can help you determine the proper time required for healing. HOME CARE INSTRUCTIONS   Apply ice to the injury for 15-20 minutes, 03-04 times per day. Put the ice in a plastic bag and place a towel between the bag of ice and your skin.  An elastic wrap (like an Ace bandage) may be used to keep swelling down.  Keep foot above the level of the heart, or at least raised on a footstool, when swelling and pain are present.  Try to avoid use other than gentle range of motion while the foot is painful. Do not resume use until instructed by your caregiver. Then begin use gradually, not increasing use to the point of pain. If pain does develop, decrease use and continue the above measures, gradually increasing activities that do not cause discomfort, until you gradually achieve normal use.  Use  crutches if and as instructed, and for the length of time instructed.  Keep injured foot and ankle wrapped between treatments.  Massage foot and ankle for comfort and to keep swelling down. Massage from the toes up towards the knee.  Only take over-the-counter or prescription medicines for pain, discomfort, or fever as directed by your caregiver. SEEK IMMEDIATE MEDICAL  CARE IF:   Your pain and swelling increase, or pain is not controlled with medications.  You have loss of feeling in your foot or your foot turns cold or blue.  You develop new, unexplained symptoms, or an increase of the symptoms that brought you to your caregiver. MAKE SURE YOU:   Understand these instructions.  Will watch your condition.  Will get help right away if you are not doing well or get worse. Document Released: 12/11/2001 Document Revised: 09/13/2011 Document Reviewed: 02/08/2008 [facility] Patient Information 2015 [place], [place]. This information is not intended to replace advice given to you by your health care provider. Make sure you discuss any questions you have with your health care provider.

## 2014-12-12 NOTE — Progress Notes (Signed)
Subjective:    Patient ID: Frank [name], male    DOB: October 06, 1975, 39 y.o.   MRN: [id number]  Chief Complaint  Patient presents with  . Establish Care    worried about his heart, gets pain in his chest and gets numb in a couple of his fingers, gets a pain in his shoulder along with the numbness, has a problem with his right foot    HPI:  Frank [name] is a 39 y.o. male with a PMH of back pain and anxiety who presents today for an office visit.   1.) Chest pains - Associated symptom of located in his chest has been going on for several months. Describes the pain as dull and pressure . Notes that there is numbness down his left arm with a couple of his fingers feeling numb. There was an event at work that he was seen in the ED for and found there was nothing wrong. These episodes are described as waxing and waning. Modifying factors include relaxing and breathing which helps to calm himself. He also takes aspirin when he experiences these episodes. Has not been evaluated by Cardiology.   2.) Right foot - This is a new problem. Associated symptom of foot pain located on the dorsum of his left foot has been going on for a couple of weeks. Notes that the pain occurs when he passively inverts his right foot and does feel it when walking. The pain is described as sharp. Modifying factors include ice/heat which has not helped very much. Denies any trauma or changes to footwear.  Allergies  Allergen Reactions  . Garlic Nausea And Vomiting    Stomach ache    No current outpatient prescriptions on file prior to visit.   No current facility-administered medications on file prior to visit.   Wt Readings from Last 3 Encounters:  12/12/14 240 lb 12.8 oz (109.226 kg)  03/08/14 242 lb 2 oz (109.827 kg)  05/25/13 237 lb (107.502 kg)    Allergies  Allergen Reactions  . Garlic Nausea And Vomiting    Stomach ache    No current outpatient prescriptions on file prior to visit.   No current  facility-administered medications on file prior to visit.    Review of Systems  Constitutional: Negative for fever and chills.  Respiratory: Positive for chest tightness. Negative for shortness of breath.   Cardiovascular: Positive for chest pain. Negative for palpitations and leg swelling.  Musculoskeletal:       Positive for foot pain.   Neurological: Negative for weakness and light-headedness.      Objective:    BP 102/80 mmHg  Pulse 92  Temp(Src) 98.3 F (36.8 C) (Oral)  Resp 18  Ht  (1.88 m)  Wt 240 lb 12.8 oz (109.226 kg)  BMI 30.90 kg/m2  SpO2 97% Nursing note and vital signs reviewed.  Physical Exam  Constitutional: He is oriented to person, place, and time. He appears well-developed and well-nourished. No distress.  Cardiovascular: Normal rate, regular rhythm, normal heart sounds and intact distal pulses.   Pulmonary/Chest: Effort normal and breath sounds normal.  Musculoskeletal:  No obvious deformity or discoloration of right foot noted. Mild edema present. Palpable tenderness over dorsal aspect of the lateral foot around fourth metatarsal. Passive inversion and resistive eversion increased the discomfort. Metatarsal carotids are negative. Distal pulses are intact and appropriate.  Neurological: He is alert and oriented to person, place, and time.  Skin: Skin is warm and dry.  Psychiatric: He has  a normal mood and affect. His behavior is normal. Judgment and thought content normal.       Assessment & Plan:   Problem List Items Addressed This Visit      Other   Right foot pain    Symptoms and exam consistent with right foot strain, however cannot rule out fracture. Obtain x-rays to rule out fracture. Treat conservatively at this time with supportive shoe and ice and heat as needed. Over-the-counter anti-inflammatories as needed for discomfort. Follow up pending x-rays.      Relevant Orders   DG Foot Complete Right   Pain in the chest - Primary     Symptom of chest pain of undetermined origin. In office EKG reveals normal sinus rhythm. Unlikely cardiac origin and cannot rule out anxiety as the mechanism of his symptoms. Refer to cardiology for stress test to rule out cardiac involvement. Continue to monitor at this time as patient is not interested in trying medications. Seek further emergency care if symptoms worsen or fail to improve with relaxation. Follow up pending cardiology appointment.      Relevant Orders   EKG 12-Lead (Completed)   Ambulatory referral to Cardiology

## 2014-12-12 NOTE — Assessment & Plan Note (Signed)
Symptoms and exam consistent with right foot strain, however cannot rule out fracture. Obtain x-rays to rule out fracture. Treat conservatively at this time with supportive shoe and ice and heat as needed. Over-the-counter anti-inflammatories as needed for discomfort. Follow up pending x-rays.

## 2014-12-13 ENCOUNTER — Telehealth: Payer: Self-pay | Admitting: Family

## 2014-12-13 NOTE — Telephone Encounter (Signed)
Please inform patient that his foot x-rays are negative for fracture. Therefore please wear a supportive shoe and continue to ice/heat.

## 2014-12-13 NOTE — Telephone Encounter (Signed)
Pt aware of results 

## 2014-12-16 ENCOUNTER — Encounter: Payer: Self-pay | Admitting: Cardiology

## 2015-02-19 ENCOUNTER — Encounter: Payer: 59 | Admitting: Cardiology

## 2015-02-19 NOTE — Progress Notes (Signed)
     HPI: 39 year old male for evaluation of chest pain.  No current outpatient prescriptions on file.   No current facility-administered medications for this visit.    Allergies  Allergen Reactions  . Garlic Nausea And Vomiting    Stomach ache    Past Medical History  Diagnosis Date  . DDD (degenerative disc disease), lumbar     Past Surgical History  Procedure Laterality Date  . Knee arthroscopy      at [facility]    Social History   Social History  . Marital Status: Married    Spouse Name: N/A  . Number of Children: 3  . Years of Education: 14   Occupational History  . Not on file.   Social History Main Topics  . Smoking status: Never Smoker   . Smokeless tobacco: Never Used  . Alcohol Use: Yes     Comment: occ  . Drug Use: No  . Sexual Activity:    Partners: Female   Other Topics Concern  . Not on file   Social History Narrative   [employer]- BS Lobbyist. Played soccer, volleyball. Married - 2006. 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 Marriage is in good health.    Family History  Problem Relation Age of Onset  . Hypertension Mother   . Depression Mother   . Cancer Maternal Grandmother     colon  . Cancer Maternal Grandfather     throat  . Diabetes Neg Hx     ROS: no fevers or chills, productive cough, hemoptysis, dysphasia, odynophagia, melena, hematochezia, dysuria, hematuria, rash, seizure activity, orthopnea, PND, pedal edema, claudication. Remaining systems are negative.  Physical Exam:   There were no vitals taken for this visit.  General:  Well developed/well nourished in NAD Skin warm/dry Patient not depressed No peripheral clubbing Back-normal HEENT-normal/normal eyelids Neck supple/normal carotid upstroke bilaterally; no bruits; no JVD; no thyromegaly chest - CTA/ normal expansion CV - RRR/normal S1 and S2; no murmurs, rubs or gallops;  PMI nondisplaced Abdomen -NT/ND, no HSM, no  mass, + bowel sounds, no bruit 2+ femoral pulses, no bruits Ext-no edema, chords, 2+ DP Neuro-grossly nonfocal  ECG 12/12/2014-sinus rhythm with no ST changes.   This encounter was created in error - please disregard.

## 2015-02-24 ENCOUNTER — Encounter: Payer: Self-pay | Admitting: Cardiology

## 2015-03-28 ENCOUNTER — Ambulatory Visit (INDEPENDENT_AMBULATORY_CARE_PROVIDER_SITE_OTHER): Payer: 59 | Admitting: Family

## 2015-03-28 ENCOUNTER — Encounter: Payer: Self-pay | Admitting: Family

## 2015-03-28 VITALS — BP 104/68 | HR 80 | Temp 97.9°F | Resp 18 | Ht 74.0 in | Wt 252.1 lb

## 2015-03-28 DIAGNOSIS — Z23 Encounter for immunization: Secondary | ICD-10-CM | POA: Diagnosis not present

## 2015-03-28 DIAGNOSIS — L259 Unspecified contact dermatitis, unspecified cause: Secondary | ICD-10-CM | POA: Diagnosis not present

## 2015-03-28 DIAGNOSIS — B353 Tinea pedis: Secondary | ICD-10-CM | POA: Insufficient documentation

## 2015-03-28 MED ORDER — HYDROCORTISONE 2.5 % EX CREA: TOPICAL_CREAM | Freq: Two times a day (BID) | CUTANEOUS | Status: DC

## 2015-03-28 MED ORDER — CLOTRIMAZOLE-BETAMETHASONE 1-0.05 % EX LOTN: TOPICAL_LOTION | Freq: Two times a day (BID) | CUTANEOUS | Status: DC

## 2015-03-28 MED ORDER — PREDNISONE 10 MG PO TABS: ORAL_TABLET | ORAL | Status: DC

## 2015-03-28 NOTE — Progress Notes (Signed)
   Subjective:    Patient ID: Frank [name], male    DOB: 02/16/1976, 39 y.o.   MRN: [id number]  Chief Complaint  Patient presents with  . Rash    has a rash all over body, itches, red, x4 days    HPI:  Frank [name] is a 39 y.o. male who  has a past medical history of DDD (degenerative disc disease), lumbar. and presents today for an acute office visit.  1.) Allergic reaction - This is a new problem. Associated symptom of a rash located all over his body and described as red and itchy has been going on for approximately 4 days. Denies any modifying factors that make it better or worse. Notes that it has spread since initial onset in no specific pattern. Reports a change in soap.  2.) Tinea pedis - Associated symptom of a fungal infection located on his right foot that has been refractory to the modifying factor of OTC antifungal medications.    Allergies  Allergen Reactions  . Garlic Nausea And Vomiting    Stomach ache    No current outpatient prescriptions on file prior to visit.   No current facility-administered medications on file prior to visit.    Review of Systems  Constitutional: Negative for fever and chills.  Skin: Positive for rash.      Objective:    BP 104/68 mmHg  Pulse 80  Temp(Src) 97.9 F (36.6 C) (Oral)  Resp 18  Ht  (1.88 m)  Wt 252 lb 1.9 oz (114.361 kg)  BMI 32.36 kg/m2  SpO2 97% Nursing note and vital signs reviewed.  Physical Exam  Constitutional: He is oriented to person, place, and time. He appears well-developed and well-nourished. No distress.  Cardiovascular: Normal rate, regular rhythm, normal heart sounds and intact distal pulses.   Pulmonary/Chest: Effort normal and breath sounds normal.  Neurological: He is alert and oriented to person, place, and time.  Skin: Skin is warm and dry.  Diffuse areas of uticara and patches that are red and slightly raised are sporadically located across his upper extremity, lower extremity  and trunk.   Area of fungal infection noted on his right foot.   Psychiatric: He has a normal mood and affect. His behavior is normal. Judgment and thought content normal.       Assessment & Plan:   Problem List Items Addressed This Visit      Musculoskeletal and Integument   Contact dermatitis    Symptoms and exam consistent with contact dermatitis most likely related to changing body care products. Discontinue body care products causing symptoms. Start prednisone taper. Start hydrocortisone 2.5% as needed for itching. Follow-up if symptoms worsen or fail to improve.      Relevant Medications   predniSONE (DELTASONE) 10 MG tablet   hydrocortisone 2.5 % cream   Tinea pedis    Symptoms and exam consistent with tinea pedis refractory to over-the-counter medication use. Start Lotrisone. If no improvement noted will start Diflucan. Follow-up if symptoms worsen or fail to improve prior to then.      Relevant Medications   clotrimazole-betamethasone (LOTRISONE) lotion    Other Visit Diagnoses    Encounter for immunization    -  Primary

## 2015-03-28 NOTE — Progress Notes (Signed)
Pre visit review using our clinic review tool, if applicable. No additional management support is needed unless otherwise documented below in the visit note. 

## 2015-03-28 NOTE — Assessment & Plan Note (Signed)
Symptoms and exam consistent with tinea pedis refractory to over-the-counter medication use. Start Lotrisone. If no improvement noted will start Diflucan. Follow-up if symptoms worsen or fail to improve prior to then.

## 2015-03-28 NOTE — Assessment & Plan Note (Signed)
Symptoms and exam consistent with contact dermatitis most likely related to changing body care products. Discontinue body care products causing symptoms. Start prednisone taper. Start hydrocortisone 2.5% as needed for itching. Follow-up if symptoms worsen or fail to improve.

## 2015-03-28 NOTE — Patient Instructions (Signed)
Thank you for choosing [facility].  Summary/Instructions:  6 Day Prednisone Taper Instructions:   Day 1: Two tablets before breakfast, one after lunch, one after dinner, and two at bedtime.  Day 2: One tablet before breakfast, one after lunch, one after dinner, and two at bedtime Day 3: One tablet before breakfast, one after lunch, one after dinner, and one at bedtime Day 4: One tablet before breakfast, one after lunch, and one at bedtime Day 5: One tablet before breakfast and one at bedtime Day 6: One tablet before breakfast  Your prescription(s) have been submitted to your pharmacy or been printed and provided for you. Please take as directed and contact our office if you believe you are having problem(s) with the medication(s) or have any questions.  If your symptoms worsen or fail to improve, please contact our office for further instruction, or in case of emergency go directly to the emergency room at the closest medical facility.   Contact Dermatitis Contact dermatitis is a reaction to certain substances that touch the skin. Contact dermatitis can be either irritant contact dermatitis or allergic contact dermatitis. Irritant contact dermatitis does not require previous exposure to the substance for a reaction to occur.Allergic contact dermatitis only occurs if you have been exposed to the substance before. Upon a repeat exposure, your body reacts to the substance.  CAUSES  Many substances can cause contact dermatitis. Irritant dermatitis is most commonly caused by repeated exposure to mildly irritating substances, such as:  Makeup.  Soaps.  Detergents.  Bleaches.  Acids.  Metal salts, such as nickel. Allergic contact dermatitis is most commonly caused by exposure to:  Poisonous plants.  Chemicals (deodorants, shampoos).  Jewelry.  Latex.  Neomycin in triple antibiotic cream.  Preservatives in products, including clothing. SYMPTOMS  The area of skin that  is exposed may develop:  Dryness or flaking.  Redness.  Cracks.  Itching.  Pain or a burning sensation.  Blisters. With allergic contact dermatitis, there may also be swelling in areas such as the eyelids, mouth, or genitals.  DIAGNOSIS  Your caregiver can usually tell what the problem is by doing a physical exam. In cases where the cause is uncertain and an allergic contact dermatitis is suspected, a patch skin test may be performed to help determine the cause of your dermatitis. TREATMENT Treatment includes protecting the skin from further contact with the irritating substance by avoiding that substance if possible. Barrier creams, powders, and gloves may be helpful. Your caregiver may also recommend:  Steroid creams or ointments applied 2 times daily. For best results, soak the rash area in cool water for 20 minutes. Then apply the medicine. Cover the area with a plastic wrap. You can store the steroid cream in the refrigerator for a "chilly" effect on your rash. That may decrease itching. Oral steroid medicines may be needed in more severe cases.  Antibiotics or antibacterial ointments if a skin infection is present.  Antihistamine lotion or an antihistamine taken by mouth to ease itching.  Lubricants to keep moisture in your skin.  Burow's solution to reduce redness and soreness or to dry a weeping rash. Mix one packet or tablet of solution in 2 cups cool water. Dip a clean washcloth in the mixture, wring it out a bit, and put it on the affected area. Leave the cloth in place for 30 minutes. Do this as often as possible throughout the day.  Taking several cornstarch or baking soda baths daily if the area is too  large to cover with a washcloth. Harsh chemicals, such as alkalis or acids, can cause skin damage that is like a burn. You should flush your skin for 15 to 20 minutes with cold water after such an exposure. You should also seek immediate medical care after exposure. Bandages  (dressings), antibiotics, and pain medicine may be needed for severely irritated skin.  HOME CARE INSTRUCTIONS  Avoid the substance that caused your reaction.  Keep the area of skin that is affected away from hot water, soap, sunlight, chemicals, acidic substances, or anything else that would irritate your skin.  Do not scratch the rash. Scratching may cause the rash to become infected.  You may take cool baths to help stop the itching.  Only take over-the-counter or prescription medicines as directed by your caregiver.  See your caregiver for follow-up care as directed to make sure your skin is healing properly. SEEK MEDICAL CARE IF:   Your condition is not better after 3 days of treatment.  You seem to be getting worse.  You see signs of infection such as swelling, tenderness, redness, soreness, or warmth in the affected area.  You have any problems related to your medicines. Document Released: 06/18/2000 Document Revised: 09/13/2011 Document Reviewed: 11/24/2010 [facility] Patient Information 2015 [place], [place]. This information is not intended to replace advice given to you by your health care provider. Make sure you discuss any questions you have with your health care provider.

## 2015-04-08 ENCOUNTER — Telehealth: Payer: Self-pay | Admitting: Family

## 2015-04-10 ENCOUNTER — Encounter: Payer: Self-pay | Admitting: Internal Medicine

## 2015-04-10 ENCOUNTER — Ambulatory Visit (INDEPENDENT_AMBULATORY_CARE_PROVIDER_SITE_OTHER): Payer: 59 | Admitting: Internal Medicine

## 2015-04-10 ENCOUNTER — Telehealth: Payer: Self-pay | Admitting: Family

## 2015-04-10 ENCOUNTER — Other Ambulatory Visit (INDEPENDENT_AMBULATORY_CARE_PROVIDER_SITE_OTHER): Payer: 59

## 2015-04-10 VITALS — BP 110/80 | HR 104 | Temp 99.1°F | Wt 254.0 lb

## 2015-04-10 DIAGNOSIS — L500 Allergic urticaria: Secondary | ICD-10-CM

## 2015-04-10 LAB — BASIC METABOLIC PANEL
BUN: 15 mg/dL (ref 6–23)
CHLORIDE: 105 meq/L (ref 96–112)
CO2: 27 meq/L (ref 19–32)
CREATININE: 0.85 mg/dL (ref 0.40–1.50)
Calcium: 9.4 mg/dL (ref 8.4–10.5)
GFR: 106.92 mL/min (ref >60.00)
Glucose, Bld: 97 mg/dL (ref 70–99)
Potassium: 4.1 mEq/L (ref 3.5–5.1)
Sodium: 139 mEq/L (ref 135–145)

## 2015-04-10 LAB — CBC WITH DIFFERENTIAL/PLATELET
BASOS PCT: 0.6 % (ref 0.0–3.0)
Basophils Absolute: 0 10*3/uL (ref 0.0–0.1)
EOS ABS: 0.1 10*3/uL (ref 0.0–0.7)
EOS PCT: 1.9 % (ref 0.0–5.0)
HEMATOCRIT: 43.3 % (ref 39.0–52.0)
Hemoglobin: 15 g/dL (ref 13.0–17.0)
LYMPHS PCT: 33.8 % (ref 12.0–46.0)
Lymphs Abs: 2.5 10*3/uL (ref 0.7–4.0)
MCHC: 34.5 g/dL (ref 30.0–36.0)
MCV: 87.8 fl (ref 78.0–100.0)
Monocytes Absolute: 0.7 10*3/uL (ref 0.1–1.0)
Monocytes Relative: 9.9 % (ref 3.0–12.0)
NEUTROS ABS: 4 10*3/uL (ref 1.4–7.7)
Neutrophils Relative %: 53.8 % (ref 43.0–77.0)
PLATELETS: 323 10*3/uL (ref 150.0–400.0)
RBC: 4.94 Mil/uL (ref 4.22–5.81)
RDW: 12.6 % (ref 11.5–15.5)
WBC: 7.4 10*3/uL (ref 4.0–10.5)

## 2015-04-10 LAB — HIV ANTIBODY (ROUTINE TESTING W REFLEX): HIV: NONREACTIVE

## 2015-04-10 LAB — HEPATIC FUNCTION PANEL
ALBUMIN: 4.3 g/dL (ref 3.5–5.2)
ALT: 85 U/L — ABNORMAL HIGH (ref 0–53)
AST: 45 U/L — ABNORMAL HIGH (ref 0–37)
Alkaline Phosphatase: 65 U/L (ref 39–117)
BILIRUBIN DIRECT: 0.1 mg/dL (ref 0.0–0.3)
TOTAL PROTEIN: 7.5 g/dL (ref 6.0–8.3)
Total Bilirubin: 0.6 mg/dL (ref 0.2–1.2)

## 2015-04-10 LAB — TSH: TSH: 1.22 u[IU]/mL (ref 0.35–4.50)

## 2015-04-10 LAB — VITAMIN D 25 HYDROXY (VIT D DEFICIENCY, FRACTURES): VITD: 11.15 ng/mL — ABNORMAL LOW (ref 30.00–100.00)

## 2015-04-10 LAB — SEDIMENTATION RATE: SED RATE: 10 mm/h (ref 0–22)

## 2015-04-10 LAB — VITAMIN B12: Vitamin B-12: 356 pg/mL (ref 211–911)

## 2015-04-10 MED ORDER — METHYLPREDNISOLONE ACETATE 80 MG/ML IJ SUSP
80.0000 mg | Freq: Once | INTRAMUSCULAR | Status: AC
  Administered 2015-04-10: 80 mg via INTRAMUSCULAR

## 2015-04-10 MED ORDER — CETIRIZINE HCL 10 MG PO TABS: 10.0000 mg | ORAL_TABLET | Freq: Every day | ORAL | Status: AC

## 2015-04-10 MED ORDER — HYDROXYZINE HCL 50 MG PO TABS: 50.0000 mg | ORAL_TABLET | Freq: Three times a day (TID) | ORAL | Status: DC | PRN

## 2015-04-10 MED ORDER — MOMETASONE FUROATE 0.1 % EX CREA: TOPICAL_CREAM | CUTANEOUS | Status: DC

## 2015-04-10 NOTE — Assessment & Plan Note (Addendum)
?  etiology Depomedrol 80 mg Labs Hydroxyzine prn Zyrtec

## 2015-04-10 NOTE — Telephone Encounter (Signed)
Pt came out of his OV with you and he stated you said you would be his PCP. Please advise.

## 2015-04-10 NOTE — Progress Notes (Signed)
Pre visit review using our clinic review tool, if applicable. No additional management support is needed unless otherwise documented below in the visit note. 

## 2015-04-10 NOTE — Telephone Encounter (Signed)
Ok Thx 

## 2015-04-10 NOTE — Progress Notes (Signed)
Subjective:  Patient ID: Frank [name], male    DOB: 1975-07-15  Age: 39 y.o. MRN: [id number]  CC: No chief complaint on file.   HPI Frank [name] presents for very extensive hives x 3 weeks: itching and rash start in PM. No oral lesions  Outpatient Prescriptions Prior to Visit  Medication Sig Dispense Refill  . clotrimazole-betamethasone (LOTRISONE) lotion Apply topically 2 (two) times daily. 30 mL 0  . hydrocortisone 2.5 % cream Apply topically 2 (two) times daily. 30 g 0  . predniSONE (DELTASONE) 10 MG tablet Take 6 tablets x 1 day, 5 tablets x 1 day, 4 tablets x 1 day, 3 tablets x 1 day, 2 tablets x 1 day, 1 tablet x 1 day. 21 tablet 0   No facility-administered medications prior to visit.    ROS Review of Systems  Constitutional: Negative for appetite change, fatigue and unexpected weight change.  HENT: Negative for congestion, nosebleeds, sneezing, sore throat and trouble swallowing.   Eyes: Negative for itching and visual disturbance.  Respiratory: Negative for cough.   Cardiovascular: Negative for chest pain, palpitations and leg swelling.  Gastrointestinal: Negative for nausea, diarrhea, blood in stool and abdominal distention.  Genitourinary: Negative for frequency and hematuria.  Musculoskeletal: Negative for back pain, joint swelling, gait problem and neck pain.  Skin: Positive for rash.  Neurological: Negative for dizziness, tremors, speech difficulty and weakness.  Psychiatric/Behavioral: Negative for sleep disturbance, dysphoric mood and agitation. The patient is not nervous/anxious.     Objective:  BP 110/80 mmHg  Pulse 104  Temp(Src) 99.1 F (37.3 C) (Oral)  Wt 254 lb (115.214 kg)  SpO2 97%  BP Readings from Last 3 Encounters:  04/10/15 110/80  03/28/15 104/68  12/12/14 102/80    Wt Readings from Last 3 Encounters:  04/10/15 254 lb (115.214 kg)  03/28/15 252 lb 1.9 oz (114.361 kg)  12/12/14 240 lb 12.8 oz (109.226 kg)    Physical Exam    Constitutional: He is oriented to person, place, and time. He appears well-developed. No distress.  NAD  HENT:  Mouth/Throat: Oropharynx is clear and moist.  Eyes: Conjunctivae are normal. Pupils are equal, round, and reactive to light.  Neck: Normal range of motion. No JVD present. No thyromegaly present.  Cardiovascular: Normal rate, regular rhythm, normal heart sounds and intact distal pulses.  Exam reveals no gallop and no friction rub.   No murmur heard. Pulmonary/Chest: Effort normal and breath sounds normal. No respiratory distress. He has no wheezes. He has no rales. He exhibits no tenderness.  Abdominal: Soft. Bowel sounds are normal. He exhibits no distension and no mass. There is no tenderness. There is no rebound and no guarding.  Musculoskeletal: Normal range of motion. He exhibits no edema or tenderness.  Lymphadenopathy:    He has no cervical adenopathy.  Neurological: He is alert and oriented to person, place, and time. He has normal reflexes. No cranial nerve deficit. He exhibits normal muscle tone. He displays a negative Romberg sign. Coordination and gait normal.  Skin: Skin is warm and dry. Rash noted.  Psychiatric: He has a normal mood and affect. His behavior is normal. Judgment and thought content normal.  small rash patches  Lab Results  Component Value Date   WBC 7.4 04/10/2015   HGB 15.0 04/10/2015   HCT 43.3 04/10/2015   PLT 323.0 04/10/2015   GLUCOSE 97 04/10/2015   CHOL 148 05/25/2013   TRIG 164.0* 05/25/2013   HDL 28.00* 05/25/2013   LDLDIRECT 102.8  09/07/2010   LDLCALC 87 05/25/2013   ALT 85* 04/10/2015   AST 45* 04/10/2015   NA 139 04/10/2015   K 4.1 04/10/2015   CL 105 04/10/2015   CREATININE 0.85 04/10/2015   BUN 15 04/10/2015   CO2 27 04/10/2015   TSH 1.22 04/10/2015    Dg Foot Complete Right  12/13/2014   CLINICAL DATA:  Lateral right foot pain for 1 month. No known injury.  EXAM: RIGHT FOOT COMPLETE - 3+ VIEW  COMPARISON:  None.   FINDINGS: There is no evidence of fracture or dislocation. There is no evidence of arthropathy or other focal bone abnormality. Soft tissues are unremarkable.  IMPRESSION: Negative.   Electronically Signed   By: Myles Rosenthal M.D.   On: 12/13/2014 07:58    Assessment & Plan:   Diagnoses and all orders for this visit:  Allergic urticaria -     Allergy profile region II-DC, [place], MD, , [place]; Future -     Allergen food profile specific IgE; Future -     Basic metabolic panel; Future -     CBC with Differential/Platelet; Future -     Hepatic function panel; Future -     Sedimentation rate; Future -     HIV antibody; Future -     TSH; Future -     Vitamin B12; Future -     Vit D  25 hydroxy (rtn osteoporosis monitoring); Future -     DG Chest 2 View -     methylPREDNISolone acetate (DEPO-MEDROL) injection 80 mg; Inject 1 mL (80 mg total) into the muscle once.  Other orders -     cetirizine (ZYRTEC) 10 MG tablet; Take 1 tablet (10 mg total) by mouth daily. -     hydrOXYzine (ATARAX/VISTARIL) 50 MG tablet; Take 1-2 tablets (50-100 mg total) by mouth 3 (three) times daily as needed for itching. -     mometasone (ELOCON) 0.1 % cream; Use bid  I have discontinued Mr. Rew's predniSONE and hydrocortisone. I am also having him start on cetirizine, hydrOXYzine, and mometasone. Additionally, I am having him maintain his clotrimazole-betamethasone. We administered methylPREDNISolone acetate.  Meds ordered this encounter  Medications  . cetirizine (ZYRTEC) 10 MG tablet    Sig: Take 1 tablet (10 mg total) by mouth daily.    Dispense:  100 tablet    Refill:  3  . hydrOXYzine (ATARAX/VISTARIL) 50 MG tablet    Sig: Take 1-2 tablets (50-100 mg total) by mouth 3 (three) times daily as needed for itching.    Dispense:  60 tablet    Refill:  1  . mometasone (ELOCON) 0.1 % cream    Sig: Use bid    Dispense:  50 g    Refill:  2  . methylPREDNISolone acetate (DEPO-MEDROL) injection 80 mg    Sig:        Follow-up: Return in about 2 weeks (around 04/24/2015) for a follow-up visit.  Sonda Primes, MD

## 2015-04-11 NOTE — Telephone Encounter (Signed)
Ok with me 

## 2015-04-12 ENCOUNTER — Other Ambulatory Visit: Payer: Self-pay | Admitting: Internal Medicine

## 2015-04-12 MED ORDER — ERGOCALCIFEROL 1.25 MG (50000 UT) PO CAPS
50000.0000 [IU] | ORAL_CAPSULE | ORAL | Status: DC
Start: 2015-04-12 — End: 2015-12-04

## 2015-04-14 LAB — RESPIRATORY ALLERGY PROFILE REGION II ~~LOC~~
Allergen, Cedar tree, t12: 0.72 kU/L — ABNORMAL HIGH
Alternaria Alternata: <0.1 kU/L
BERMUDA GRASS: 1.14 kU/L — AB
BOX ELDER: 0.37 kU/L — AB
COMMON RAGWEED: 1.06 kU/L — AB
Cat Dander: <0.1 kU/L
Dog Dander: <0.1 kU/L
Elm IgE: <0.1 kU/L
Johnson Grass: 0.27 kU/L — ABNORMAL HIGH
Oak: <0.1 kU/L
Pecan/Hickory Tree IgE: 0.24 kU/L — ABNORMAL HIGH
Rough Pigweed  IgE: <0.1 kU/L
TIMOTHY GRASS: 1.79 kU/L — AB

## 2015-04-14 LAB — ALLERGEN FOOD PROFILE SPECIFIC IGE
Apple: <0.1 kU/L
Chicken IgE: <0.1 kU/L
Corn: <0.1 kU/L
Egg White IgE: <0.1 kU/L
Fish Cod: <0.1 kU/L
IGE (IMMUNOGLOBULIN E), SERUM: 114 kU/L (ref <115)
Shrimp IgE: <0.1 kU/L
Soybean IgE: <0.1 kU/L
Tomato IgE: <0.1 kU/L
Tuna IgE: <0.1 kU/L

## 2015-04-28 ENCOUNTER — Ambulatory Visit (INDEPENDENT_AMBULATORY_CARE_PROVIDER_SITE_OTHER): Payer: 59 | Admitting: Internal Medicine

## 2015-04-28 ENCOUNTER — Encounter: Payer: Self-pay | Admitting: Internal Medicine

## 2015-04-28 VITALS — BP 110/60 | HR 107 | Wt 254.0 lb

## 2015-04-28 DIAGNOSIS — R7989 Other specified abnormal findings of blood chemistry: Secondary | ICD-10-CM

## 2015-04-28 DIAGNOSIS — L500 Allergic urticaria: Secondary | ICD-10-CM | POA: Diagnosis not present

## 2015-04-28 DIAGNOSIS — R945 Abnormal results of liver function studies: Secondary | ICD-10-CM

## 2015-04-28 DIAGNOSIS — E559 Vitamin D deficiency, unspecified: Secondary | ICD-10-CM | POA: Insufficient documentation

## 2015-04-28 MED ORDER — VITAMIN D 1000 UNITS PO TABS: 1000.0000 [IU] | ORAL_TABLET | Freq: Every day | ORAL | Status: AC

## 2015-04-28 MED ORDER — METHYLPREDNISOLONE ACETATE 80 MG/ML IJ SUSP
80.0000 mg | Freq: Once | INTRAMUSCULAR | Status: AC
  Administered 2015-04-28: 80 mg via INTRAMUSCULAR

## 2015-04-28 NOTE — Assessment & Plan Note (Signed)
Vit D 

## 2015-04-28 NOTE — Assessment & Plan Note (Addendum)
[facility] reviewed w/pt Start Zyrtec Will give Depomedrol 80 mg IM again

## 2015-04-28 NOTE — Progress Notes (Signed)
Subjective:  Patient ID: Frank [name], male    DOB: [birth year]  Age: 39 y.o. MRN: [id number]  CC: No chief complaint on file.   HPI Frank [name] presents for a f/u of extensive hives x 4 weeks: itching and rash start in PM- overall much better. Last rash episode last Fri. No oral lesions. Pt is not taking Zyrtec  Outpatient Prescriptions Prior to Visit  Medication Sig Dispense Refill  . cetirizine (ZYRTEC) 10 MG tablet Take 1 tablet (10 mg total) by mouth daily. 100 tablet 3  . clotrimazole-betamethasone (LOTRISONE) lotion Apply topically 2 (two) times daily. 30 mL 0  . ergocalciferol (VITAMIN D2) 50000 UNITS capsule Take 1 capsule (50,000 Units total) by mouth once a week. 6 capsule 0  . hydrOXYzine (ATARAX/VISTARIL) 50 MG tablet Take 1-2 tablets (50-100 mg total) by mouth 3 (three) times daily as needed for itching. 60 tablet 1  . mometasone (ELOCON) 0.1 % cream Use bid 50 g 2   No facility-administered medications prior to visit.    ROS Review of Systems  Constitutional: Negative for appetite change, fatigue and unexpected weight change.  HENT: Negative for congestion, nosebleeds, sneezing, sore throat and trouble swallowing.   Eyes: Negative for itching and visual disturbance.  Respiratory: Negative for cough.   Cardiovascular: Negative for chest pain, palpitations and leg swelling.  Gastrointestinal: Negative for nausea, diarrhea, blood in stool and abdominal distention.  Genitourinary: Negative for frequency and hematuria.  Musculoskeletal: Negative for back pain, joint swelling, gait problem and neck pain.  Skin: Positive for rash.  Neurological: Negative for dizziness, tremors, speech difficulty and weakness.  Psychiatric/Behavioral: Negative for sleep disturbance, dysphoric mood and agitation. The patient is not nervous/anxious.     Objective:  BP 110/60 mmHg  Pulse 107  Wt 254 lb (115.214 kg)  SpO2 97%  BP Readings from Last 3 Encounters:  04/28/15 110/60   04/10/15 110/80  03/28/15 104/68    Wt Readings from Last 3 Encounters:  04/28/15 254 lb (115.214 kg)  04/10/15 254 lb (115.214 kg)  03/28/15 252 lb 1.9 oz (114.361 kg)    Physical Exam  Constitutional: He is oriented to person, place, and time. He appears well-developed. No distress.  NAD  HENT:  Mouth/Throat: Oropharynx is clear and moist.  Eyes: Conjunctivae are normal. Pupils are equal, round, and reactive to light.  Neck: Normal range of motion. No JVD present. No thyromegaly present.  Cardiovascular: Normal rate, regular rhythm, normal heart sounds and intact distal pulses.  Exam reveals no gallop and no friction rub.   No murmur heard. Pulmonary/Chest: Effort normal and breath sounds normal. No respiratory distress. He has no wheezes. He has no rales. He exhibits no tenderness.  Abdominal: Soft. Bowel sounds are normal. He exhibits no distension and no mass. There is no tenderness. There is no rebound and no guarding.  Musculoskeletal: Normal range of motion. He exhibits no edema or tenderness.  Lymphadenopathy:    He has no cervical adenopathy.  Neurological: He is alert and oriented to person, place, and time. He has normal reflexes. No cranial nerve deficit. He exhibits normal muscle tone. He displays a negative Romberg sign. Coordination and gait normal.  Skin: Skin is warm and dry. Rash noted.  Psychiatric: He has a normal mood and affect. His behavior is normal. Judgment and thought content normal.    Lab Results  Component Value Date   WBC 7.4 04/10/2015   HGB 15.0 04/10/2015   HCT 43.3 04/10/2015   PLT  323.0 04/10/2015   GLUCOSE 97 04/10/2015   CHOL 148 05/25/2013   TRIG 164.0* 05/25/2013   HDL 28.00* 05/25/2013   LDLDIRECT 102.8 09/07/2010   LDLCALC 87 05/25/2013   ALT 85* 04/10/2015   AST 45* 04/10/2015   NA 139 04/10/2015   K 4.1 04/10/2015   CL 105 04/10/2015   CREATININE 0.85 04/10/2015   BUN 15 04/10/2015   CO2 27 04/10/2015   TSH 1.22  04/10/2015    Dg Foot Complete Right  12/13/2014   CLINICAL DATA:  Lateral right foot pain for 1 month. No known injury.  EXAM: RIGHT FOOT COMPLETE - 3+ VIEW  COMPARISON:  None.  FINDINGS: There is no evidence of fracture or dislocation. There is no evidence of arthropathy or other focal bone abnormality. Soft tissues are unremarkable.  IMPRESSION: Negative.   Electronically Signed   By: Myles RosenthalJohn  Stahl M.D.   On: 12/13/2014 07:58    Assessment & Plan:   Diagnoses and all orders for this visit:  Vitamin D deficiency  Allergic urticaria -     Hepatic function panel; Future -     Hepatitis C Antibody; Future -     Hepatitis B Surface AntiBODY; Future -     Hepatitis B Surface AntiGEN; Future -     Ferritin; Future -     methylPREDNISolone acetate (DEPO-MEDROL) injection 80 mg; Inject 1 mL (80 mg total) into the muscle once.  Elevated LFTs -     US Abdomen Complete -     Hepatic function panel; Future -     Hepatitis C Antibody; Future -     Hepatitis B Surface AntiBODY; Future -     Hepatitis B Surface AntiGEN; Future -     Ferritin; Future  Other orders -     cholecalciferol (VITAMIN D) 1000 UNITS tablet; Take 1 tablet (1,000 Units total) by mouth daily.   I am having Mr. Frank [name] start on cholecalciferol. I am also having him maintain his clotrimazole-betamethasone, cetirizine, hydrOXYzine, mometasone, and ergocalciferol. We administered methylPREDNISolone acetate.  Meds ordered this encounter  Medications  . cholecalciferol (VITAMIN D) 1000 UNITS tablet    Sig: Take 1 tablet (1,000 Units total) by mouth daily.    Dispense:  100 tablet    Refill:  3  . methylPREDNISolone acetate (DEPO-MEDROL) injection 80 mg    Sig:      Follow-up: Return in about 6 weeks (around 06/09/2015) for a follow-up visit.  Sonda PrimesAlex Plotnikov, MD

## 2015-04-28 NOTE — Assessment & Plan Note (Signed)
10/16 ?etiology Abd US Labs

## 2015-05-02 ENCOUNTER — Ambulatory Visit
Admission: RE | Admit: 2015-05-02 | Discharge: 2015-05-02 | Disposition: A | Discharge status: Home / Self Care | Payer: 59 | Source: Ambulatory Visit | Attending: Internal Medicine | Admitting: Internal Medicine

## 2015-06-10 ENCOUNTER — Ambulatory Visit (INDEPENDENT_AMBULATORY_CARE_PROVIDER_SITE_OTHER)
Admission: RE | Admit: 2015-06-10 | Discharge: 2015-06-10 | Disposition: A | Discharge status: Home / Self Care | Payer: 59 | Source: Ambulatory Visit | Attending: Internal Medicine | Admitting: Internal Medicine

## 2015-06-10 DIAGNOSIS — L500 Allergic urticaria: Secondary | ICD-10-CM | POA: Diagnosis not present

## 2015-06-12 ENCOUNTER — Ambulatory Visit (INDEPENDENT_AMBULATORY_CARE_PROVIDER_SITE_OTHER): Payer: 59 | Admitting: Internal Medicine

## 2015-06-12 ENCOUNTER — Encounter: Payer: Self-pay | Admitting: Internal Medicine

## 2015-06-12 VITALS — BP 102/60 | HR 92 | Wt 257.0 lb

## 2015-06-12 DIAGNOSIS — L500 Allergic urticaria: Secondary | ICD-10-CM | POA: Diagnosis not present

## 2015-06-12 NOTE — Assessment & Plan Note (Signed)
no sx's w/daily Zyrtec. Hives come back if he would skip a dose... D/c Hydroxyzine  Allergy ref

## 2015-06-12 NOTE — Progress Notes (Signed)
Pre visit review using our clinic review tool, if applicable. No additional management support is needed unless otherwise documented below in the visit note. 

## 2015-06-12 NOTE — Progress Notes (Signed)
Subjective:  Patient ID: Frank [name], male    DOB: 04-01-1976  Age: 39 y.o. MRN: [id number]  CC: No chief complaint on file.   HPI Frank [name] presents for hives - no sx's w/daily Zyrtec. Hives come back if he would skip a dose...  Outpatient Prescriptions Prior to Visit  Medication Sig Dispense Refill  . cetirizine (ZYRTEC) 10 MG tablet Take 1 tablet (10 mg total) by mouth daily. 100 tablet 3  . cholecalciferol (VITAMIN D) 1000 UNITS tablet Take 1 tablet (1,000 Units total) by mouth daily. 100 tablet 3  . clotrimazole-betamethasone (LOTRISONE) lotion Apply topically 2 (two) times daily. 30 mL 0  . ergocalciferol (VITAMIN D2) 50000 UNITS capsule Take 1 capsule (50,000 Units total) by mouth once a week. 6 capsule 0  . hydrOXYzine (ATARAX/VISTARIL) 50 MG tablet Take 1-2 tablets (50-100 mg total) by mouth 3 (three) times daily as needed for itching. 60 tablet 1  . mometasone (ELOCON) 0.1 % cream Use bid 50 g 2   No facility-administered medications prior to visit.    ROS Review of Systems  Constitutional: Negative for appetite change, fatigue and unexpected weight change.  HENT: Negative for congestion, nosebleeds, sneezing, sore throat and trouble swallowing.   Eyes: Negative for itching and visual disturbance.  Respiratory: Negative for cough.   Cardiovascular: Negative for chest pain, palpitations and leg swelling.  Gastrointestinal: Negative for nausea, diarrhea, blood in stool and abdominal distention.  Genitourinary: Negative for frequency and hematuria.  Musculoskeletal: Negative for back pain, joint swelling, gait problem and neck pain.  Skin: Positive for rash.  Neurological: Negative for dizziness, tremors, speech difficulty and weakness.  Psychiatric/Behavioral: Negative for sleep disturbance, dysphoric mood and agitation. The patient is not nervous/anxious.     Objective:  BP 102/60 mmHg  Pulse 92  Wt 257 lb (116.574 kg)  SpO2 96%  BP Readings from Last  3 Encounters:  06/12/15 102/60  04/28/15 110/60  04/10/15 110/80    Wt Readings from Last 3 Encounters:  06/12/15 257 lb (116.574 kg)  04/28/15 254 lb (115.214 kg)  04/10/15 254 lb (115.214 kg)    Physical Exam  Constitutional: He is oriented to person, place, and time. He appears well-developed. No distress.  NAD  HENT:  Mouth/Throat: Oropharynx is clear and moist.  Eyes: Conjunctivae are normal. Pupils are equal, round, and reactive to light.  Neck: Normal range of motion. No JVD present. No thyromegaly present.  Cardiovascular: Normal rate, regular rhythm, normal heart sounds and intact distal pulses.  Exam reveals no gallop and no friction rub.   No murmur heard. Pulmonary/Chest: Effort normal and breath sounds normal. No respiratory distress. He has no wheezes. He has no rales. He exhibits no tenderness.  Abdominal: Soft. Bowel sounds are normal. He exhibits no distension and no mass. There is no tenderness. There is no rebound and no guarding.  Musculoskeletal: Normal range of motion. He exhibits no edema or tenderness.  Lymphadenopathy:    He has no cervical adenopathy.  Neurological: He is alert and oriented to person, place, and time. He has normal reflexes. No cranial nerve deficit. He exhibits normal muscle tone. He displays a negative Romberg sign. Coordination and gait normal.  Skin: Skin is warm and dry. No rash noted.  Psychiatric: He has a normal mood and affect. His behavior is normal. Judgment and thought content normal.    Lab Results  Component Value Date   WBC 7.4 04/10/2015   HGB 15.0 04/10/2015   HCT 43.3  04/10/2015   PLT 323.0 04/10/2015   GLUCOSE 97 04/10/2015   CHOL 148 05/25/2013   TRIG 164.0* 05/25/2013   HDL 28.00* 05/25/2013   LDLDIRECT 102.8 09/07/2010   LDLCALC 87 05/25/2013   ALT 85* 04/10/2015   AST 45* 04/10/2015   NA 139 04/10/2015   K 4.1 04/10/2015   CL 105 04/10/2015   CREATININE 0.85 04/10/2015   BUN 15 04/10/2015   CO2 27  04/10/2015   TSH 1.22 04/10/2015    Dg Chest 2 View  06/10/2015  CLINICAL DATA:  Hives for 3 weeks, history of allergies, itching EXAM: CHEST  2 VIEW COMPARISON:  Chest x-ray of 01/03/2014 FINDINGS: No active infiltrate or effusion is seen. There is no evidence of mediastinal or hilar adenopathy. The heart is within normal limits in size. No bony abnormality is seen. IMPRESSION: No active cardiopulmonary disease. Electronically Signed   By: Dwyane DeePaul  Barry M.D.   On: 06/10/2015 10:20    Assessment & Plan:   Diagnoses and all orders for this visit:  Allergic urticaria -     Ambulatory referral to Allergy   I am having Mr. Alipio maintain his clotrimazole-betamethasone, cetirizine, hydrOXYzine, mometasone, ergocalciferol, and cholecalciferol.  No orders of the defined types were placed in this encounter.     Follow-up: Return in about 1 year (around 06/11/2016) for Wellness Exam.  Sonda PrimesAlex Plotnikov, MD

## 2015-07-16 ENCOUNTER — Ambulatory Visit (INDEPENDENT_AMBULATORY_CARE_PROVIDER_SITE_OTHER): Payer: 59 | Admitting: Allergy and Immunology

## 2015-07-16 ENCOUNTER — Encounter: Payer: Self-pay | Admitting: Allergy and Immunology

## 2015-07-16 VITALS — BP 128/72 | HR 76 | Temp 98.2°F | Resp 16 | Ht 73.23 in | Wt 252.4 lb

## 2015-07-16 DIAGNOSIS — L500 Allergic urticaria: Secondary | ICD-10-CM

## 2015-07-16 DIAGNOSIS — K759 Inflammatory liver disease, unspecified: Secondary | ICD-10-CM

## 2015-07-16 NOTE — Patient Instructions (Signed)
  1. Continue cetirizine 10 mg one tablet one time per day  2. Blood: Hepatitis viral screening, ANA with reflex, LKM antibody, anti-smooth muscle antibody, antimitochondrial antibody, liver function profile, thyroid peroxidase antibody, T4, TSH  3. Further evaluation? - Yes if progressive hives or other associated symptoms  4. Return to clinic in 12 weeks or earlier if problem

## 2015-07-16 NOTE — Progress Notes (Signed)
[facility]    NEW PATIENT NOTE  Referring Provider: Plotnikov, Frank QuintAleksei [name], Frank [name] Primary Provider: Sonda PrimesAlex [name], Frank [name] Date of office visit: 07/16/2015    Subjective:   Chief Complaint:  Frank [name] is a 40 y.o. male with a chief complaint of Rash and Pruritis  who presents to the clinic with the following problems:  HPI Comments:  Frank [name] presents this clinic on 07/16/2015 in evaluation of hives. For about 3 months he's been having red raised itchy lesions across his body without any associated systemic or constitutional symptoms. His lesions never heal with scar or hyperpigmentation. He gets a good response to the use of cetirizine on a regular basis with almost complete control of his urticaria and has associated dermatographia. There is no obvious trigger giving rise to these problems. He has not had a significant environmental change and he is not on any new medications and he does not use any new supplements. Investigation by his primary physician included blood test which identified aeroallergens hypersensitivity, no food hypersensitivity, normal CBC, hepatitis with elevation of ALT and AST and a low vitamin D level in October 2016   Past Medical History  Diagnosis Date  . DDD (degenerative disc disease), lumbar     Past Surgical History  Procedure Laterality Date  . Knee arthroscopy      at [place]  . Tonsillectomy    . Tonsillectomy  1980    Current Outpatient Prescriptions on File Prior to Visit  Medication Sig Dispense Refill  . cetirizine (ZYRTEC) 10 MG tablet Take 1 tablet (10 mg total) by mouth daily. 100 tablet 3  . cholecalciferol (VITAMIN D) 1000 UNITS tablet Take 1 tablet (1,000 Units total) by mouth daily. 100 tablet 3  . clotrimazole-betamethasone (LOTRISONE) lotion Apply topically 2 (two) times daily. 30 mL 0  . ergocalciferol (VITAMIN D2) 50000 UNITS capsule Take 1 capsule (50,000 Units total) by  mouth once a week. 6 capsule 0  . hydrOXYzine (ATARAX/VISTARIL) 50 MG tablet Take 1-2 tablets (50-100 mg total) by mouth 3 (three) times daily as needed for itching. (Patient taking differently: Take 50-100 mg by mouth 3 (three) times daily as needed for itching. Pt only takes at bedtime.) 60 tablet 1  . mometasone (ELOCON) 0.1 % cream Use bid 50 g 2   No current facility-administered medications on file prior to visit.    No orders of the defined types were placed in this encounter.    Allergies  Allergen Reactions  . Garlic Nausea And Vomiting    Stomach ache    Review of systems negative except as noted in HPI / PMHx or noted below:  Review of Systems  Constitutional: Negative.   HENT: Negative.   Eyes: Negative.   Respiratory: Negative.   Cardiovascular: Negative.   Gastrointestinal: Negative.   Genitourinary: Negative.   Musculoskeletal: Negative.   Skin: Negative.   Neurological: Negative.   Endo/Heme/Allergies: Negative.   Psychiatric/Behavioral: Negative.     Family History  Problem Relation Age of Onset  . Hypertension Mother   . Depression Mother   . Cancer Maternal Grandmother     colon  . Cancer Maternal Grandfather     throat  . Diabetes Neg Hx   . Allergic rhinitis Neg Hx   . Angioedema Neg Hx   . Asthma Neg Hx   . Eczema Neg Hx   . Immunodeficiency Neg Hx   . Urticaria Neg Hx  Social History   Social History  . Marital Status: Married    Spouse Name: N/A  . Number of Children: 3  . Years of Education: 14   Occupational History  . Not on file.   Social History Main Topics  . Smoking status: Never Smoker   . Smokeless tobacco: Never Used  . Alcohol Use: Yes     Comment: occ  . Drug Use: No  . Sexual Activity:    Partners: Female   Other Topics Concern  . Not on file   Social History Narrative   [employer]- BS Lobbyist. Played soccer, volleyball. Married - 2006. 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 Marriage is in good health.    Environmental and Social history  Lives in a townhouse with a dry [place], no animals located inside the household, no carpeting in the bedroom, sleeping on a stuff mattress without any plastic for the bed or pillow, and no smoking ongoing with inside the household. He works in the Music therapist for [employer].   Objective:   Filed Vitals:   07/16/15 0912  BP: 128/72  Pulse: 76  Temp: 98.2 F (36.8 C)  Resp: 16   Height: 6' 1.23" (186 cm) Weight: 252 lb 6.8 oz (114.5 kg)  Physical Exam  Constitutional: He is well-developed, well-nourished, and in no distress. No distress.  HENT:  Head: Normocephalic. Head is without right periorbital erythema and without left periorbital erythema.  Right Ear: Tympanic membrane, external ear and ear canal normal.  Left Ear: Tympanic membrane, external ear and ear canal normal.  Nose: Nose normal. No mucosal edema or rhinorrhea.  Mouth/Throat: Oropharynx is clear and moist and mucous membranes are normal. No oropharyngeal exudate.  Eyes: Conjunctivae and lids are normal. Pupils are equal, round, and reactive to light.  Neck: Trachea normal. No tracheal deviation present. No thyromegaly present.  Cardiovascular: Normal rate, regular rhythm, S1 normal, S2 normal and normal heart sounds.   No murmur heard. Pulmonary/Chest: Effort normal. No stridor. No tachypnea. No respiratory distress. He has no wheezes. He has no rales. He exhibits no tenderness.  Abdominal: Soft. He exhibits no distension and no mass. There is no hepatosplenomegaly. There is no tenderness. There is no rebound and no guarding.  Musculoskeletal: He exhibits no edema or tenderness.  Lymphadenopathy:       Head (right side): No tonsillar adenopathy present.       Head (left side): No tonsillar adenopathy present.    He has no cervical adenopathy.    He has no axillary adenopathy.  Neurological: He is alert. Gait normal.  Skin:  Rash (dermatographia isn't) noted. He is not diaphoretic. No erythema. No pallor. Nails show no clubbing.  Psychiatric: Mood and affect normal.     Diagnostics: Results of blood tests obtained on 04/12/2015 identified an AST of 45, AST of 85, negative HIV titers, TSH of 1.2 to, IgE antibodies directed against grasses and trees and weeds, but blood cell count was 7.4 with a normal differential and an absolute eosinophil count of 100, hemoglobin of 15.0, platelet count of 323 and a negative IgE food screen   Assessment and Plan:    1. Allergic urticaria   2. Hepatitis      1. Continue cetirizine 10 mg one tablet one time per day  2. Blood: Hepatitis viral screening, ANA with reflex, LKM antibody, anti-smooth muscle antibody, antimitochondrial antibody, liver function profile, thyroid peroxidase antibody, T4, TSH  3. Further evaluation? - Yes if progressive hives or other associated symptoms  4. 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 As well, we will also investigate him for possible thyroiditis which can contribute to immunologic activity manifested his urticaria. I don't think any additional skin testing is going to be helpful here as blood tests obtained by his primary care doctor did not identify any significant amount of hypersensitivity that would correlate with recurrent urticaria occurring during the winter time. Fortunately, he does get a very good response to the use of an antihistamine in regard to controlling his urticaria and we'll continue to have him use cetirizine at this point. I'll be  contacting him with the results of his blood tests once available for review.  Laurette Schimke, Frank [name] [facility]

## 2015-08-06 ENCOUNTER — Telehealth: Payer: Self-pay | Admitting: Allergy and Immunology

## 2015-08-06 NOTE — Telephone Encounter (Signed)
Pt called, he went out of town and has lost his bloodwork paperwork. Pls fill out new orders and leave them at the front for p/up. pls call when complete.

## 2015-08-06 NOTE — Telephone Encounter (Signed)
Spoke with Tammy who will fax over the completed lab form to [place].

## 2015-08-07 NOTE — Telephone Encounter (Signed)
Placed lab form and map at front desk for patient to pick up and patient notified.

## 2015-08-13 ENCOUNTER — Other Ambulatory Visit: Payer: Self-pay | Admitting: Allergy and Immunology

## 2015-08-15 LAB — HBV/HCV (PROFILE VIII)
HCV Ab: <0.1 s/co ratio (ref 0.0–0.9)
HEP B E AB: NEGATIVE
HEP B SURFACE AB, QUAL: NONREACTIVE
Hep B C IgM: NEGATIVE
Hep B Core Total Ab: NEGATIVE
Hep B E Ag: NEGATIVE
Hepatitis B Surface Ag: NEGATIVE

## 2015-08-15 LAB — ANA W/REFLEX IF POSITIVE: Anti Nuclear Antibody(ANA): NEGATIVE

## 2015-08-15 LAB — HEPATIC FUNCTION PANEL
ALBUMIN: 4.4 g/dL (ref 3.5–5.5)
ALT: 30 IU/L (ref 0–44)
AST: 23 IU/L (ref 0–40)
Alkaline Phosphatase: 67 IU/L (ref 39–117)
BILIRUBIN TOTAL: 0.5 mg/dL (ref 0.0–1.2)
BILIRUBIN, DIRECT: 0.11 mg/dL (ref 0.00–0.40)
Total Protein: 7.1 g/dL (ref 6.0–8.5)

## 2015-08-15 LAB — ANTI-SMOOTH MUSCLE ANTIBODY, IGG: Smooth Muscle Ab: 19 Units (ref 0–19)

## 2015-08-15 LAB — MITOCHONDRIAL ANTIBODIES: Mitochondrial Ab: 6.8 Units (ref 0.0–20.0)

## 2015-08-15 LAB — ANTI-MICROSOMAL ANTIBODY LIVER / KIDNEY: LKM1 Ab: 2.9 Units (ref 0.0–20.0)

## 2015-08-15 LAB — HCV COMMENT:

## 2015-08-15 LAB — THYROID PEROXIDASE ANTIBODY: Thyroperoxidase Ab SerPl-aCnc: 7 IU/mL (ref 0–34)

## 2015-08-15 LAB — TSH+FREE T4
Free T4: 1.49 ng/dL (ref 0.82–1.77)
TSH: 1.81 u[IU]/mL (ref 0.450–4.500)

## 2015-10-07 ENCOUNTER — Encounter: Payer: Self-pay | Admitting: Allergy and Immunology

## 2015-10-07 ENCOUNTER — Ambulatory Visit (INDEPENDENT_AMBULATORY_CARE_PROVIDER_SITE_OTHER): Payer: 59 | Admitting: Allergy and Immunology

## 2015-10-07 VITALS — BP 128/80 | HR 80 | Resp 16

## 2015-10-07 DIAGNOSIS — L500 Allergic urticaria: Secondary | ICD-10-CM

## 2015-10-07 DIAGNOSIS — K759 Inflammatory liver disease, unspecified: Secondary | ICD-10-CM | POA: Diagnosis not present

## 2015-10-07 DIAGNOSIS — T783XXD Angioneurotic edema, subsequent encounter: Secondary | ICD-10-CM | POA: Diagnosis not present

## 2015-10-07 MED ORDER — EPINEPHRINE 0.3 MG/0.3ML IJ SOAJ: 0.3000 mg | Freq: Once | INTRAMUSCULAR | Status: DC

## 2015-10-07 NOTE — Progress Notes (Signed)
Follow-up Note  Referring Provider: Tresa GarterPlotnikov, Aleksei V, MD Primary Provider: Sonda PrimesAlex Plotnikov, MD Date of Office Visit: 10/07/2015  Subjective:   Frank [name] (DOB: [birth year]) is a 40 y.o. male who [facility] 10[facility]07/2015 in re-evaluation of the following:  HPI Comments: Frank [name] presents to this clinic in reevaluation of his urticaria. He still continues to have her urticaria. His antihistamine lasts about 12 hours or so and then he develops problems with red raised itchy lesions across his body. He's also developed an episode of uvula swelling about 2 weeks ago upon awakening in the morning that he treated with Benadryl and resolved in one day. Once again there is no obvious provoking factor giving rise to these issues. He's had a series of blood tests performed in the past in investigation of this problem which apparently identified hepatitis with an elevation of his ALT and AST. Follow-up blood tests performed Feb. 2017 in investigation possible viral or autoimmune hepatitis did not identify any abnormality. Specifically, he had a negative hepatitis C and B antibody titer, negative LK a.m. antibody, negative smooth muscle antibody, negative a N/A, negative antimitochondrial antibody.     Medication List           cetirizine 10 MG tablet  Commonly known as:  ZYRTEC  Take 1 tablet (10 mg total) by mouth daily.     cholecalciferol 1000 units tablet  Commonly known as:  VITAMIN D  Take 1 tablet (1,000 Units total) by mouth daily.     clotrimazole-betamethasone lotion  Commonly known as:  LOTRISONE  Apply topically 2 (two) times daily.     ergocalciferol 50000 units capsule  Commonly known as:  VITAMIN D2  Take 1 capsule (50,000 Units total) by mouth once a week.     hydrOXYzine 50 MG tablet  Commonly known as:  ATARAX/VISTARIL  Take 1-2 tablets (50-100 mg total) by mouth 3 (three) times daily as needed for itching.     mometasone 0.1 %  cream  Commonly known as:  ELOCON  Use bid        Past Medical History  Diagnosis Date  . DDD (degenerative disc disease), lumbar     Past Surgical History  Procedure Laterality Date  . Knee arthroscopy      at [place]  . Tonsillectomy    . Tonsillectomy  1980    Allergies  Allergen Reactions  . Garlic Nausea And Vomiting    Stomach ache    Review of systems negative except as noted in HPI / PMHx or noted below:  Review of Systems  Constitutional: Negative.   HENT: Negative.   Eyes: Negative.   Respiratory: Negative.   Cardiovascular: Negative.   Gastrointestinal: Negative.   Genitourinary: Negative.   Musculoskeletal: Negative.   Skin: Negative.   Neurological: Negative.   Endo/Heme/Allergies: Negative.   Psychiatric/Behavioral: Negative.      Objective:   Filed Vitals:   10/07/15 1003  BP: 128/80  Pulse: 80  Resp: 16          Physical Exam  Constitutional: He is well-developed, well-nourished, and in no distress.  HENT:  Head: Normocephalic.  Right Ear: Tympanic membrane, external ear and ear canal normal.  Left Ear: Tympanic membrane, external ear and ear canal normal.  Nose: Nose normal. No mucosal edema or rhinorrhea.  Mouth/Throat: Uvula is midline, oropharynx is clear and moist and mucous membranes are normal. No oropharyngeal exudate.  Eyes: Conjunctivae are normal.  Neck: Trachea normal. No tracheal tenderness present. No tracheal deviation present. No thyromegaly present.  Cardiovascular: Normal rate, regular rhythm, S1 normal, S2 normal and normal heart sounds.   No murmur heard. Pulmonary/Chest: Breath sounds normal. No stridor. No respiratory distress. He has no wheezes. He has no rales.  Musculoskeletal: He exhibits no edema.  Lymphadenopathy:       Head (right side): No tonsillar adenopathy present.       Head (left side): No tonsillar adenopathy present.    He has no cervical adenopathy.    He has no axillary adenopathy.    Neurological: He is alert. Gait normal.  Skin: No rash noted. He is not diaphoretic. No erythema. Nails show no clubbing.  Psychiatric: Mood and affect normal.    Diagnostics: None    Assessment and Plan:   1. Allergic urticaria   2. Hepatitis   3. Angioedema, subsequent encounter     1. Cetirizine  1-2 times per day. Can add benadryl  2.  Xolair?   3.  Epi-Pen, benadryl, MD/ER  4. Return in 12 weeks or sooner if problem.  Jaelen remains with immunological hyperreactivity of unknown etiologic factor in the form of urticaria and now a single episode of uvular edema. To be on the safe side we did give him a EpiPen to use in case she ever develops a severe allergic reaction and will have him use any histamine twice a day. If he still remains with problems in the face of this therapy he will be a candidate for omalizumab. I'll regroup with him in approximately 12 weeks or sooner if there is a problem.I suspect that his elevation in liver function tests is probably a reflection of fatty liver as he does not have any evidence of autoimmune hepatitis or viral hepatitis.  Laurette Schimke, MD [facility]

## 2015-10-07 NOTE — Patient Instructions (Signed)
  1. Cetirizine 10mg  1-2 times per day. Can add benadryl  2.  Xolair?   3.  Epi-Pen, benadryl, MD/ER  4. Return in 12 weeks or sooner if problem.

## 2015-12-04 ENCOUNTER — Encounter: Payer: Self-pay | Admitting: Internal Medicine

## 2015-12-04 ENCOUNTER — Ambulatory Visit (INDEPENDENT_AMBULATORY_CARE_PROVIDER_SITE_OTHER): Payer: 59 | Admitting: Internal Medicine

## 2015-12-04 VITALS — BP 118/72 | HR 107 | Temp 99.3°F | Wt 256.0 lb

## 2015-12-04 DIAGNOSIS — R222 Localized swelling, mass and lump, trunk: Secondary | ICD-10-CM | POA: Insufficient documentation

## 2015-12-04 DIAGNOSIS — L500 Allergic urticaria: Secondary | ICD-10-CM | POA: Diagnosis not present

## 2015-12-04 DIAGNOSIS — B353 Tinea pedis: Secondary | ICD-10-CM

## 2015-12-04 MED ORDER — MONTELUKAST SODIUM 10 MG PO TABS: 10.0000 mg | ORAL_TABLET | Freq: Every day | ORAL | Status: DC

## 2015-12-04 MED ORDER — CLOTRIMAZOLE-BETAMETHASONE 1-0.05 % EX LOTN: TOPICAL_LOTION | Freq: Two times a day (BID) | CUTANEOUS | Status: DC

## 2015-12-04 NOTE — Assessment & Plan Note (Addendum)
5/17 L anterior chest - ?Lipoma Chest wall POC [facility] US on the left  is c/w lipoma Surg referral

## 2015-12-04 NOTE — Progress Notes (Signed)
Pre visit review using our clinic review tool, if applicable. No additional management support is needed unless otherwise documented below in the visit note. 

## 2015-12-04 NOTE — Assessment & Plan Note (Addendum)
Zyrtec We can add Singulair and Tagamet

## 2015-12-04 NOTE — Patient Instructions (Signed)
We can add Singulair and Tagamet for hives

## 2015-12-04 NOTE — Assessment & Plan Note (Signed)
Lotrisone 

## 2015-12-04 NOTE — Progress Notes (Signed)
Subjective:  Patient ID: Frank [name], male    DOB: 1976-04-04  Age: 40 y.o. MRN: [id number]  CC: No chief complaint on file.   HPI Frank [name] presents for a new painful  lump on L front rib cage - x3 weeks. F/u hives - much better on Zyrtec. C/o sinus pain when flying.  Outpatient Prescriptions Prior to Visit  Medication Sig Dispense Refill  . cetirizine (ZYRTEC) 10 MG tablet Take 1 tablet (10 mg total) by mouth daily. 100 tablet 3  . cholecalciferol (VITAMIN D) 1000 UNITS tablet Take 1 tablet (1,000 Units total) by mouth daily. 100 tablet 3  . EPINEPHrine 0.3 mg/0.3 mL IJ SOAJ injection Inject 0.3 mLs (0.3 mg total) into the muscle once. 2 Device 2  . clotrimazole-betamethasone (LOTRISONE) lotion Apply topically 2 (two) times daily. (Patient not taking: Reported on 12/04/2015) 30 mL 0  . hydrOXYzine (ATARAX/VISTARIL) 50 MG tablet Take 1-2 tablets (50-100 mg total) by mouth 3 (three) times daily as needed for itching. (Patient not taking: Reported on 12/04/2015) 60 tablet 1  . mometasone (ELOCON) 0.1 % cream Use bid (Patient not taking: Reported on 12/04/2015) 50 g 2  . ergocalciferol (VITAMIN D2) 50000 UNITS capsule Take 1 capsule (50,000 Units total) by mouth once a week. (Patient not taking: Reported on 12/04/2015) 6 capsule 0   No facility-administered medications prior to visit.    ROS Review of Systems  Constitutional: Negative for appetite change, fatigue and unexpected weight change.  HENT: Negative for congestion, nosebleeds, sneezing, sore throat and trouble swallowing.   Eyes: Negative for itching and visual disturbance.  Respiratory: Negative for cough.   Cardiovascular: Negative for chest pain, palpitations and leg swelling.  Gastrointestinal: Negative for nausea, diarrhea, blood in stool and abdominal distention.  Genitourinary: Negative for frequency and hematuria.  Musculoskeletal: Negative for back pain, joint swelling, gait problem and neck pain.  Skin: Positive  for rash.  Neurological: Negative for dizziness, tremors, speech difficulty and weakness.  Psychiatric/Behavioral: Negative for sleep disturbance, dysphoric mood and agitation. The patient is not nervous/anxious.     Objective:  BP 118/72 mmHg  Pulse 107  Temp(Src) 99.3 F (37.4 C) (Oral)  Wt 256 lb (116.121 kg)  SpO2 97%  BP Readings from Last 3 Encounters:  12/04/15 118/72  10/07/15 128/80  07/16/15 128/72    Wt Readings from Last 3 Encounters:  12/04/15 256 lb (116.121 kg)  07/16/15 252 lb 6.8 oz (114.5 kg)  06/12/15 257 lb (116.574 kg)    Physical Exam  Constitutional: He is oriented to person, place, and time. He appears well-developed. No distress.  NAD  HENT:  Mouth/Throat: Oropharynx is clear and moist.  Eyes: Conjunctivae are normal. Pupils are equal, round, and reactive to light.  Neck: Normal range of motion. No JVD present. No thyromegaly present.  Cardiovascular: Normal rate, regular rhythm, normal heart sounds and intact distal pulses.  Exam reveals no gallop and no friction rub.   No murmur heard. Pulmonary/Chest: Effort normal and breath sounds normal. No respiratory distress. He has no wheezes. He has no rales. He exhibits tenderness.  Abdominal: Soft. Bowel sounds are normal. He exhibits no distension and no mass. There is no tenderness. There is no rebound and no guarding.  Musculoskeletal: Normal range of motion. He exhibits no edema or tenderness.  Lymphadenopathy:    He has no cervical adenopathy.  Neurological: He is alert and oriented to person, place, and time. He has normal reflexes. No cranial nerve deficit. He exhibits  normal muscle tone. He displays a negative Romberg sign. Coordination and gait normal.  Skin: Skin is warm and dry. No rash noted.  Psychiatric: He has a normal mood and affect. His behavior is normal. Judgment and thought content normal.  oval lumpy sq mass under the L nipple; mobile and tender 4x1.5 cm   Chest wall POC [facility] US  on the left  is c/w lipoma  Lab Results  Component Value Date   WBC 7.4 04/10/2015   HGB 15.0 04/10/2015   HCT 43.3 04/10/2015   PLT 323.0 04/10/2015   GLUCOSE 97 04/10/2015   CHOL 148 05/25/2013   TRIG 164.0* 05/25/2013   HDL 28.00* 05/25/2013   LDLDIRECT 102.8 09/07/2010   LDLCALC 87 05/25/2013   ALT 30 08/13/2015   AST 23 08/13/2015   NA 139 04/10/2015   K 4.1 04/10/2015   CL 105 04/10/2015   CREATININE 0.85 04/10/2015   BUN 15 04/10/2015   CO2 27 04/10/2015   TSH 1.810 08/13/2015    Dg Chest 2 View  06/10/2015  CLINICAL DATA:  Hives for 3 weeks, history of allergies, itching EXAM: CHEST  2 VIEW COMPARISON:  Chest x-ray of 01/03/2014 FINDINGS: No active infiltrate or effusion is seen. There is no evidence of mediastinal or hilar adenopathy. The heart is within normal limits in size. No bony abnormality is seen. IMPRESSION: No active cardiopulmonary disease. Electronically Signed   By: Dwyane DeePaul  Barry M.D.   On: 06/10/2015 10:20    Assessment & Plan:   There are no diagnoses linked to this encounter. I have discontinued Mr. Mcdougle's ergocalciferol. I am also having him maintain his clotrimazole-betamethasone, cetirizine, hydrOXYzine, mometasone, cholecalciferol, and EPINEPHrine.  No orders of the defined types were placed in this encounter.     Follow-up: No Follow-up on file.  Sonda PrimesAlex Plotnikov, MD

## 2016-01-26 ENCOUNTER — Telehealth: Payer: Self-pay | Admitting: Internal Medicine

## 2016-01-26 NOTE — Telephone Encounter (Signed)
Only vaccine info we have is a influenza in 03/2015.  Please advise on any other vaccines needed.

## 2016-01-26 NOTE — Telephone Encounter (Signed)
Patient is leaving Wednesday to go to [place].  Patient was looking at what the [facility] recommends for immunizations.  Patient would like to know if he needs to get any special immunizations either day or tomorrow on this short notice before he goes.  Please follow up with patient in regard. Patient is going to send email he received from [facility].  I will give to [facility] as soon as I get this.

## 2016-01-26 NOTE — Telephone Encounter (Signed)
tDAP Hep A  Please show me the [facility] fax Thx

## 2016-06-18 ENCOUNTER — Encounter: Payer: Self-pay | Admitting: Internal Medicine

## 2016-06-18 ENCOUNTER — Ambulatory Visit (INDEPENDENT_AMBULATORY_CARE_PROVIDER_SITE_OTHER): Payer: 59 | Admitting: Internal Medicine

## 2016-06-18 VITALS — BP 118/70 | HR 97 | Temp 98.4°F | Ht 73.0 in | Wt 262.0 lb

## 2016-06-18 DIAGNOSIS — Z0000 Encounter for general adult medical examination without abnormal findings: Secondary | ICD-10-CM | POA: Diagnosis not present

## 2016-06-18 DIAGNOSIS — B353 Tinea pedis: Secondary | ICD-10-CM | POA: Diagnosis not present

## 2016-06-18 DIAGNOSIS — Z23 Encounter for immunization: Secondary | ICD-10-CM

## 2016-06-18 MED ORDER — CLOTRIMAZOLE-BETAMETHASONE 1-0.05 % EX LOTN: TOPICAL_LOTION | Freq: Two times a day (BID) | CUTANEOUS | 1 refills | Status: DC

## 2016-06-18 MED ORDER — MONTELUKAST SODIUM 10 MG PO TABS: 10.0000 mg | ORAL_TABLET | Freq: Every day | ORAL | 11 refills | Status: DC

## 2016-06-18 MED ORDER — MOMETASONE FUROATE 0.1 % EX CREA: TOPICAL_CREAM | CUTANEOUS | 2 refills | Status: DC

## 2016-06-18 NOTE — Progress Notes (Signed)
Pre visit review using our clinic review tool, if applicable. No additional management support is needed unless otherwise documented below in the visit note. 

## 2016-06-18 NOTE — Addendum Note (Signed)
Addended by: Merrilyn PumaSIMMONS, Fate Caster N on: 06/18/2016 01:10 PM   Modules accepted: Orders

## 2016-06-18 NOTE — Progress Notes (Signed)
Subjective:  Patient ID: Frank [name], male    DOB: 11-01-1975  Age: 40 y.o. MRN: [id number]  CC: Annual Exam   HPI Frank Chollvin Mcewan presents for a well exam  Outpatient Medications Prior to Visit  Medication Sig Dispense Refill  . cetirizine (ZYRTEC) 10 MG tablet Take 1 tablet (10 mg total) by mouth daily. 100 tablet 3  . clotrimazole-betamethasone (LOTRISONE) lotion Apply topically 2 (two) times daily. 30 mL 0  . EPINEPHrine 0.3 mg/0.3 mL IJ SOAJ injection Inject 0.3 mLs (0.3 mg total) into the muscle once. 2 Device 2  . hydrOXYzine (ATARAX/VISTARIL) 50 MG tablet Take 1-2 tablets (50-100 mg total) by mouth 3 (three) times daily as needed for itching. 60 tablet 1  . mometasone (ELOCON) 0.1 % cream Use bid 50 g 2  . montelukast (SINGULAIR) 10 MG tablet Take 1 tablet (10 mg total) by mouth daily. 30 tablet 11   No facility-administered medications prior to visit.     ROS Review of Systems  Constitutional: Negative for appetite change, fatigue and unexpected weight change.  HENT: Negative for congestion, nosebleeds, sneezing, sore throat and trouble swallowing.   Eyes: Negative for itching and visual disturbance.  Respiratory: Negative for cough.   Cardiovascular: Negative for chest pain, palpitations and leg swelling.  Gastrointestinal: Negative for abdominal distention, blood in stool, diarrhea and nausea.  Genitourinary: Negative for frequency and hematuria.  Musculoskeletal: Negative for back pain, gait problem, joint swelling and neck pain.  Skin: Positive for rash.  Neurological: Negative for dizziness, tremors, speech difficulty and weakness.  Psychiatric/Behavioral: Negative for agitation, dysphoric mood, sleep disturbance and suicidal ideas. The patient is not nervous/anxious.     Objective:  BP 118/70   Pulse 97   Temp 98.4 F (36.9 C) (Oral)   Ht 6\' 1"  (1.854 m)   Wt 262 lb (118.8 kg)   SpO2 98%   BMI 34.57 kg/m   BP Readings from Last 3 Encounters:    06/18/16 118/70  12/04/15 118/72  10/07/15 128/80    Wt Readings from Last 3 Encounters:  06/18/16 262 lb (118.8 kg)  12/04/15 256 lb (116.1 kg)  07/16/15 252 lb 6.8 oz (114.5 kg)    Physical Exam  Constitutional: He is oriented to person, place, and time. He appears well-developed. No distress.  NAD  HENT:  Mouth/Throat: Oropharynx is clear and moist.  Eyes: Conjunctivae are normal. Pupils are equal, round, and reactive to light.  Neck: Normal range of motion. No JVD present. No thyromegaly present.  Cardiovascular: Normal rate, regular rhythm, normal heart sounds and intact distal pulses.  Exam reveals no gallop and no friction rub.   No murmur heard. Pulmonary/Chest: Effort normal and breath sounds normal. No respiratory distress. He has no wheezes. He has no rales. He exhibits no tenderness.  Abdominal: Soft. Bowel sounds are normal. He exhibits no distension and no mass. There is no tenderness. There is no rebound and no guarding.  Musculoskeletal: Normal range of motion. He exhibits no edema or tenderness.  Lymphadenopathy:    He has no cervical adenopathy.  Neurological: He is alert and oriented to person, place, and time. He has normal reflexes. No cranial nerve deficit. He exhibits normal muscle tone. He displays a negative Romberg sign. Coordination and gait normal.  Skin: Skin is warm and dry. No rash noted.  Psychiatric: He has a normal mood and affect. His behavior is normal. Judgment and thought content normal.  declined testic exam Overweight a little  Lab Results  Component Value Date   WBC 7.4 04/10/2015   HGB 15.0 04/10/2015   HCT 43.3 04/10/2015   PLT 323.0 04/10/2015   GLUCOSE 97 04/10/2015   CHOL 148 05/25/2013   TRIG 164.0 (H) 05/25/2013   HDL 28.00 (L) 05/25/2013   LDLDIRECT 102.8 09/07/2010   LDLCALC 87 05/25/2013   ALT 30 08/13/2015   AST 23 08/13/2015   NA 139 04/10/2015   K 4.1 04/10/2015   CL 105 04/10/2015   CREATININE 0.85 04/10/2015    BUN 15 04/10/2015   CO2 27 04/10/2015   TSH 1.810 08/13/2015    Dg Chest 2 View  Result Date: 06/10/2015 CLINICAL DATA:  Hives for 3 weeks, history of allergies, itching EXAM: CHEST  2 VIEW COMPARISON:  Chest x-ray of 01/03/2014 FINDINGS: No active infiltrate or effusion is seen. There is no evidence of mediastinal or hilar adenopathy. The heart is within normal limits in size. No bony abnormality is seen. IMPRESSION: No active cardiopulmonary disease. Electronically Signed   By: Dwyane DeePaul  Barry M.D.   On: 06/10/2015 10:20    Assessment & Plan:   There are no diagnoses linked to this encounter. I am having Mr. Behringer maintain his cetirizine, hydrOXYzine, mometasone, EPINEPHrine, montelukast, and clotrimazole-betamethasone.  No orders of the defined types were placed in this encounter.    Follow-up: No Follow-up on file.  Sonda PrimesAlex Plotnikov, MD

## 2016-06-18 NOTE — Patient Instructions (Signed)
McKenzie pillow

## 2016-06-18 NOTE — Assessment & Plan Note (Signed)
We discussed age appropriate health related issues, including available/recomended screening tests and vaccinations. We discussed a need for adhering to healthy diet and exercise. Labs/EKG were reviewed/ordered. All questions were answered.   

## 2017-03-03 IMAGING — CR DG FOOT COMPLETE 3+V*R*
3 series · 3 of 3 positions shown · non-contrast
Comparison: None.

CLINICAL DATA: Lateral right foot pain for 1 month. No known
injury.

EXAM:
RIGHT FOOT COMPLETE - 3+ VIEW

[view not recorded (1 of 3)]
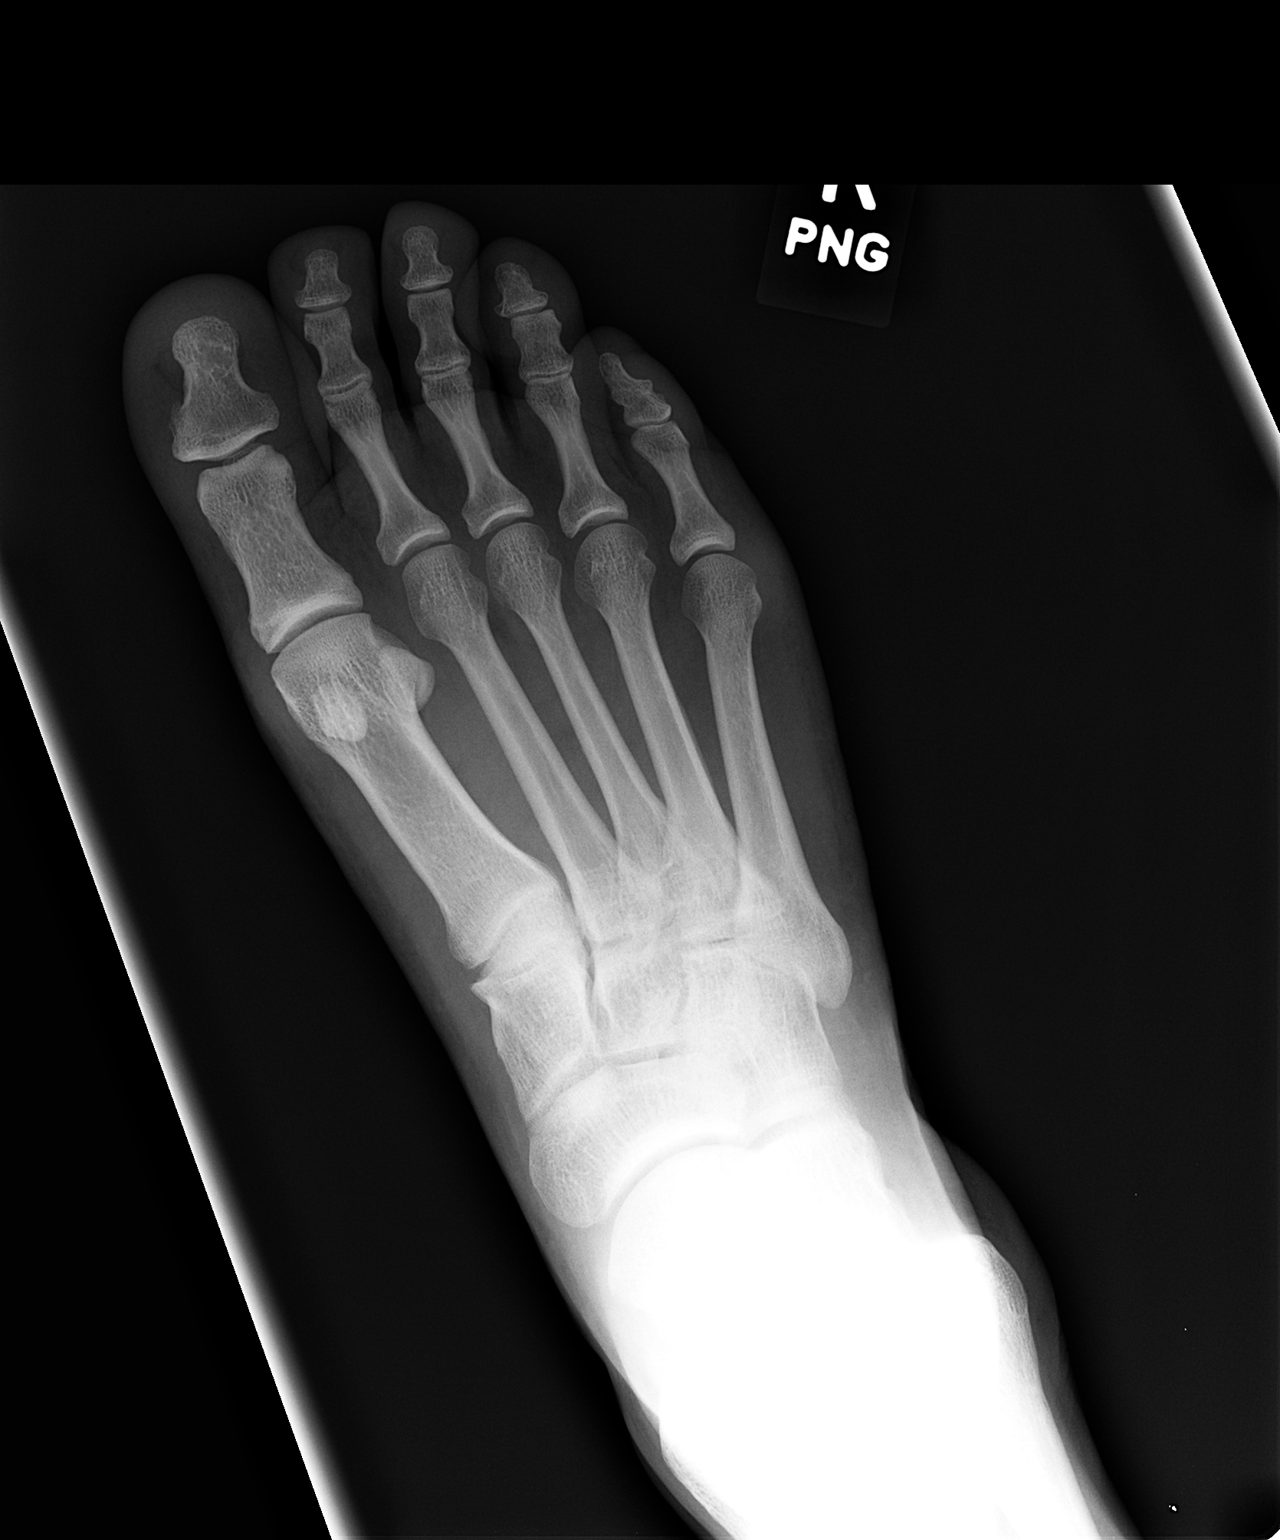

[view not recorded (2 of 3)]
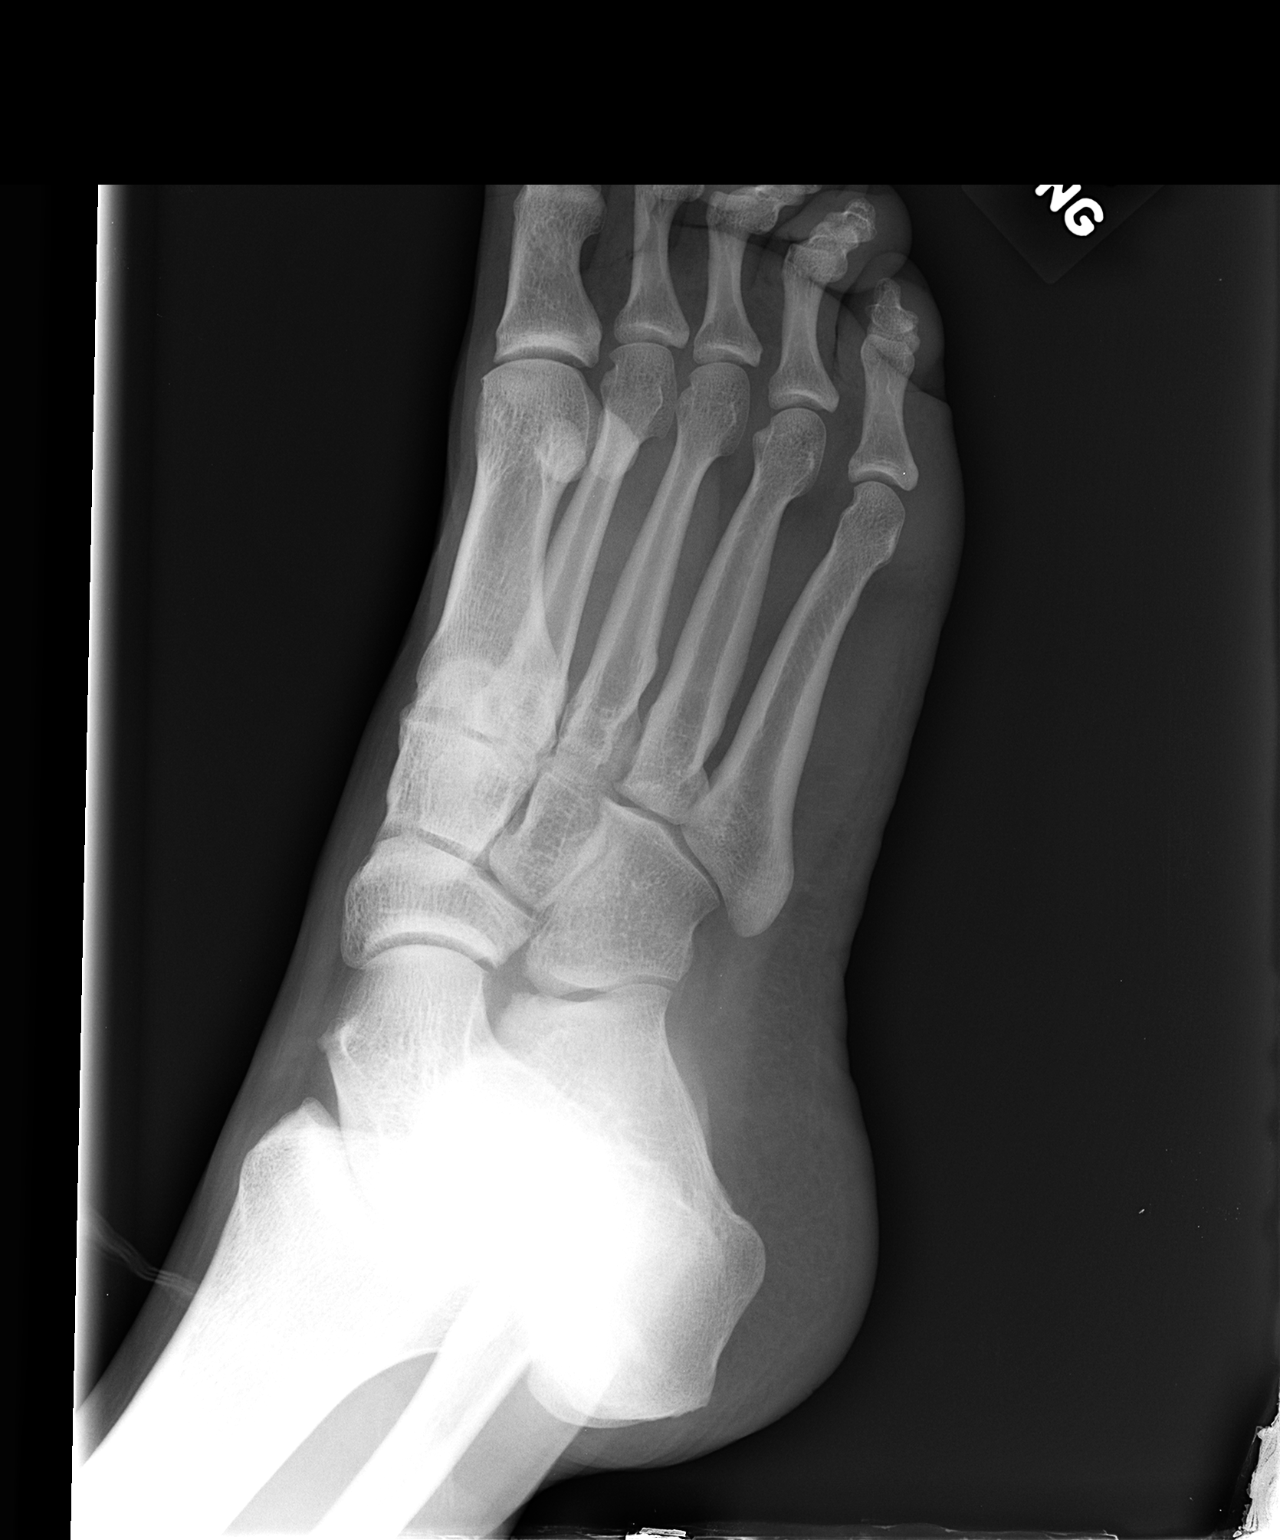

[view not recorded (3 of 3)]
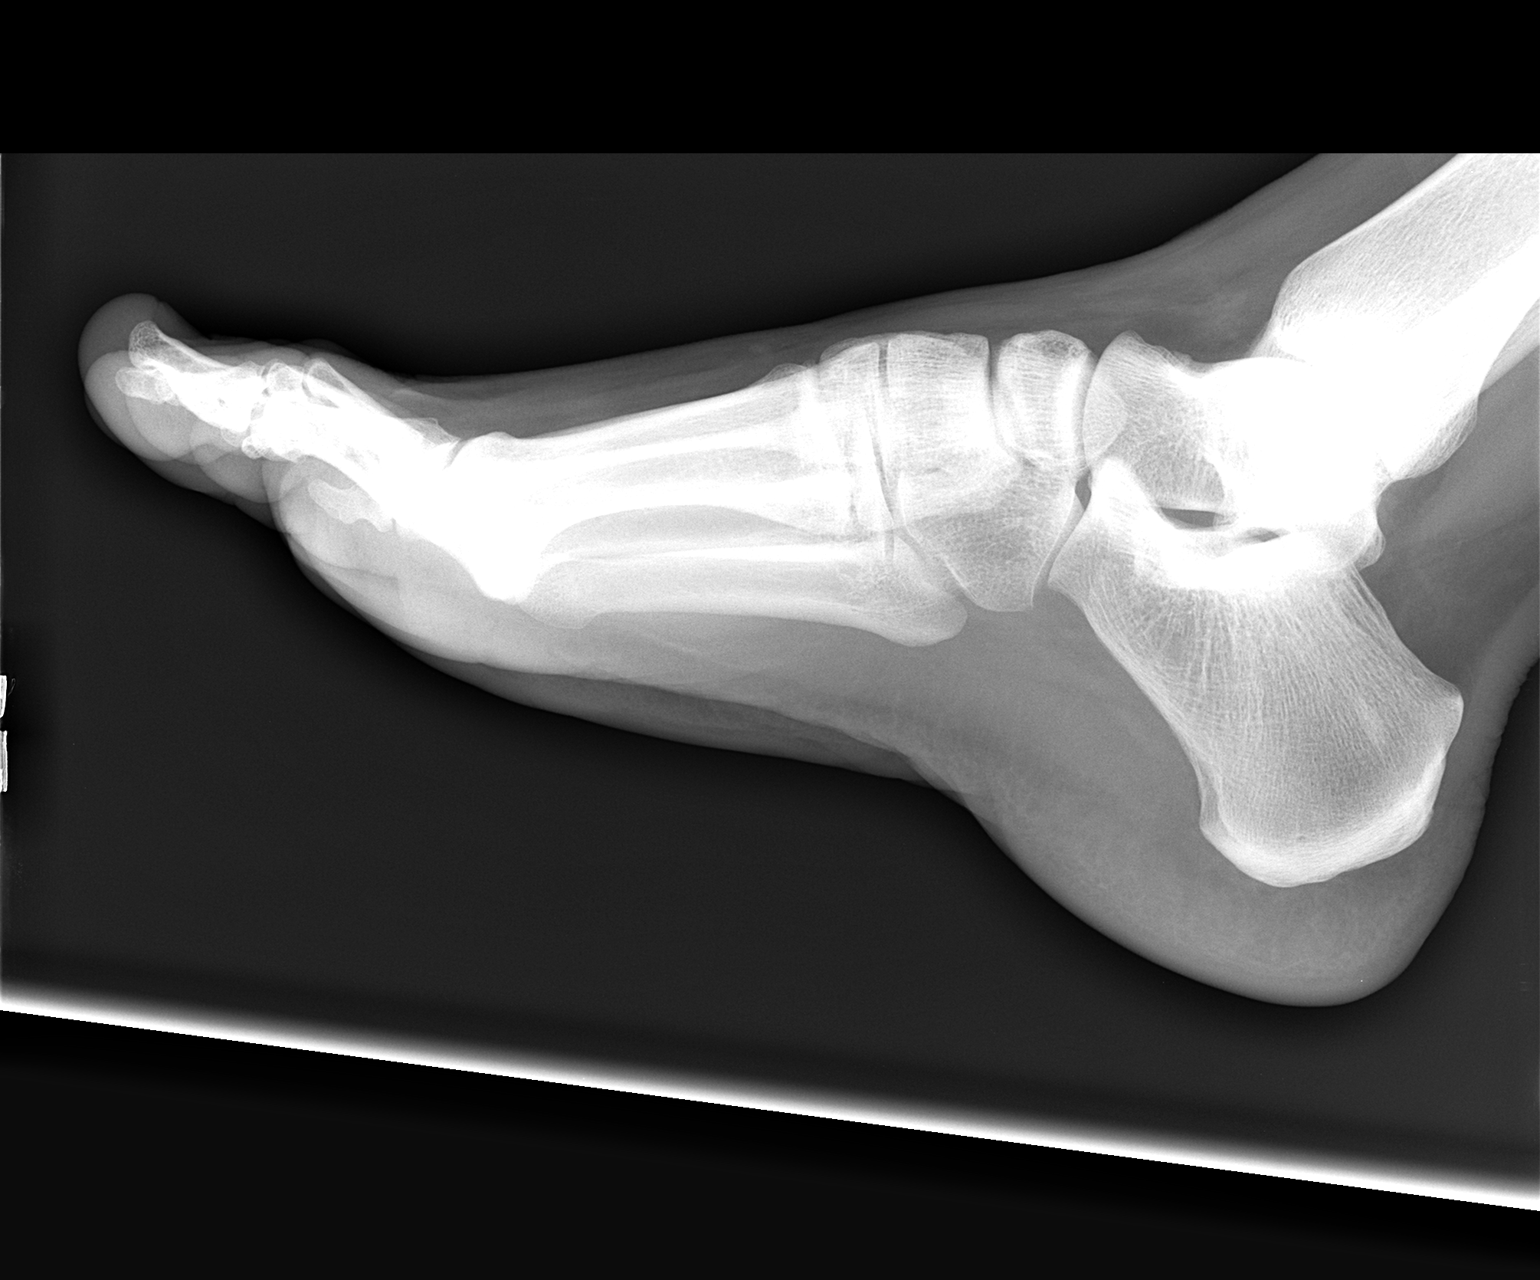

[3 of 3 positions shown; findings below may reference images not displayed]

FINDINGS: There is no evidence of fracture or dislocation. There is no
evidence of arthropathy or other focal bone abnormality. Soft
tissues are unremarkable.
IMPRESSION: Negative.

## 2017-07-22 IMAGING — US US ABDOMEN COMPLETE
1 series · 14 of 25 positions shown · non-contrast
Comparison: None.

CLINICAL DATA: Elevated LFTs

EXAM:
ULTRASOUND ABDOMEN COMPLETE

[Series 1: us abdomen complete · 0.32mm/px · 14 of 65 slices shown]
[im 1/65]
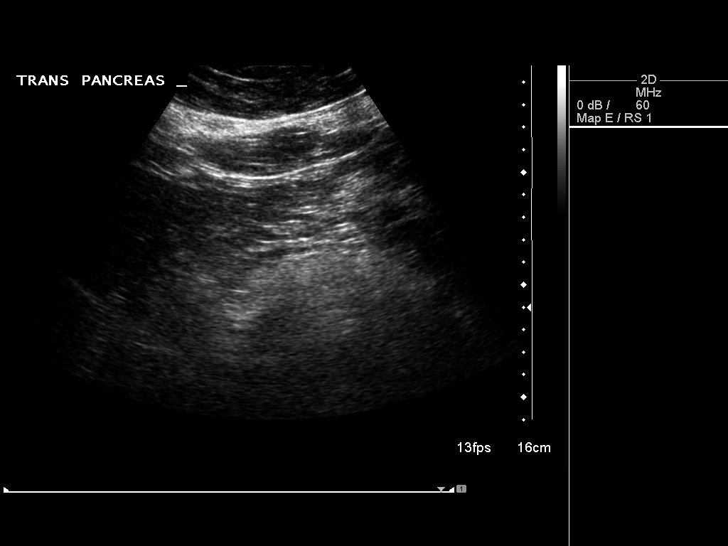
[im 6/65]
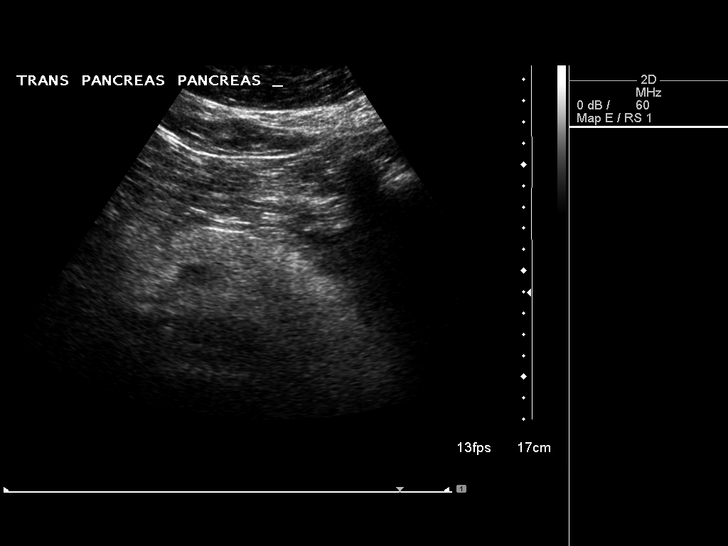
[im 11/65]
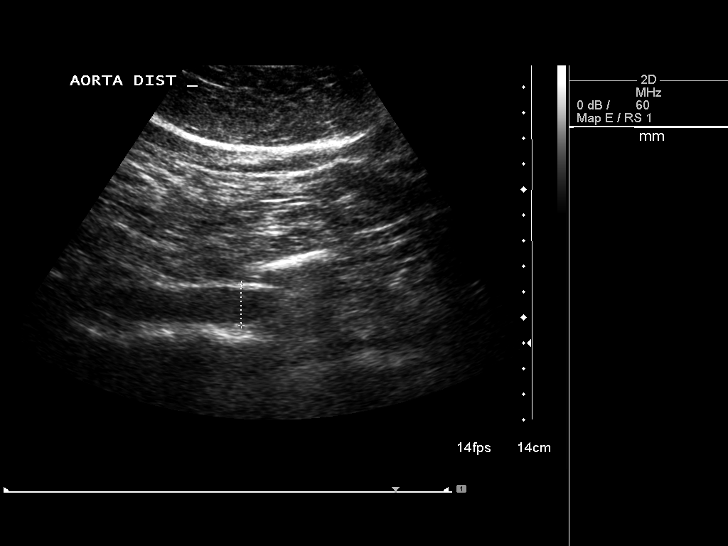
[im 17/65]
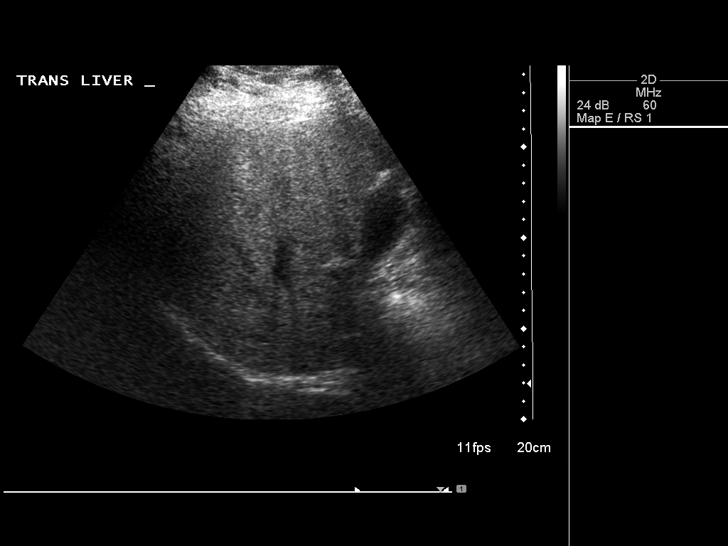
[im 22/65]
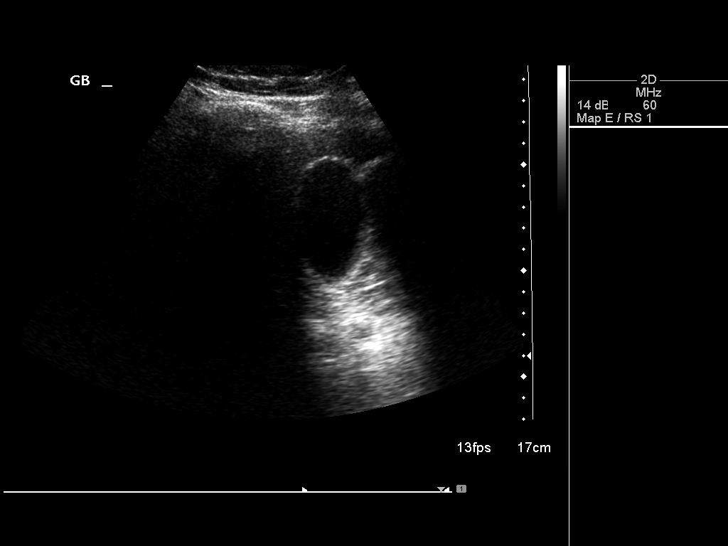
[im 25/65]
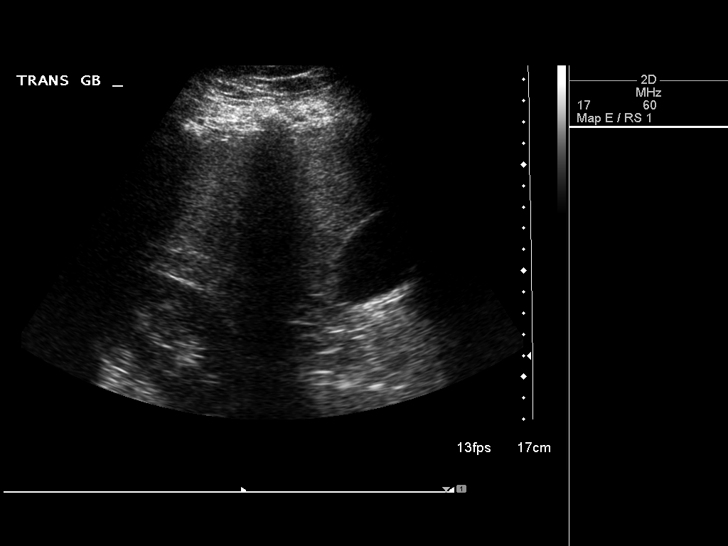
[im 30/65]
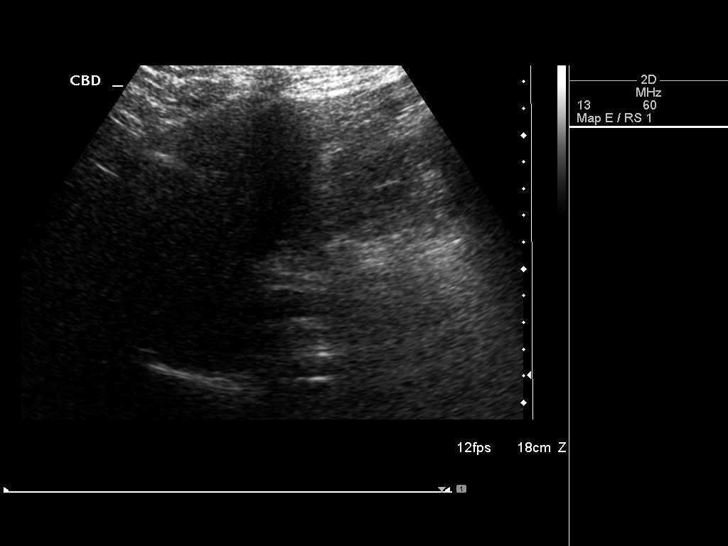
[im 35/65]
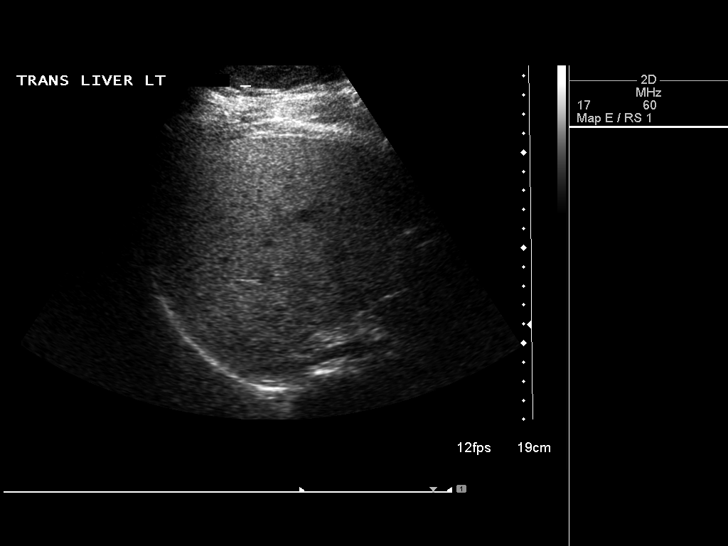
[im 41/65]
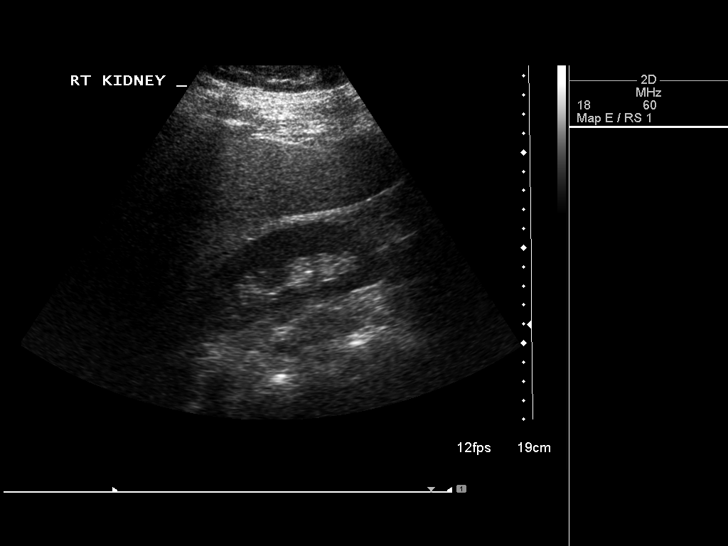
[im 43/65]
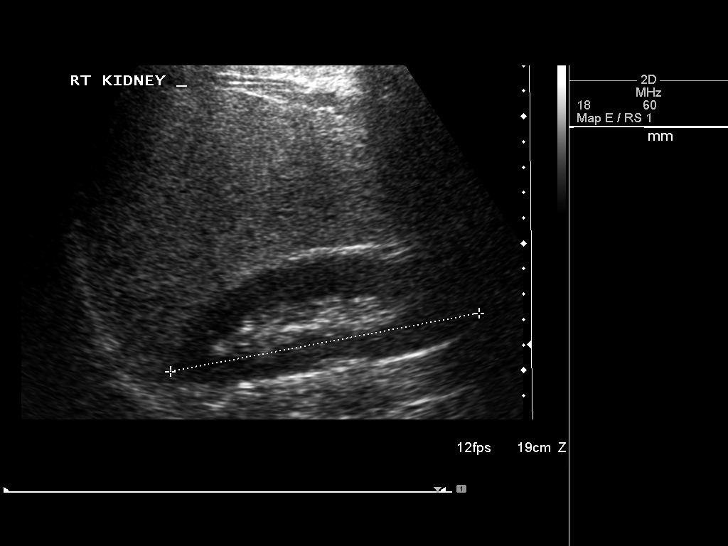
[im 49/65]
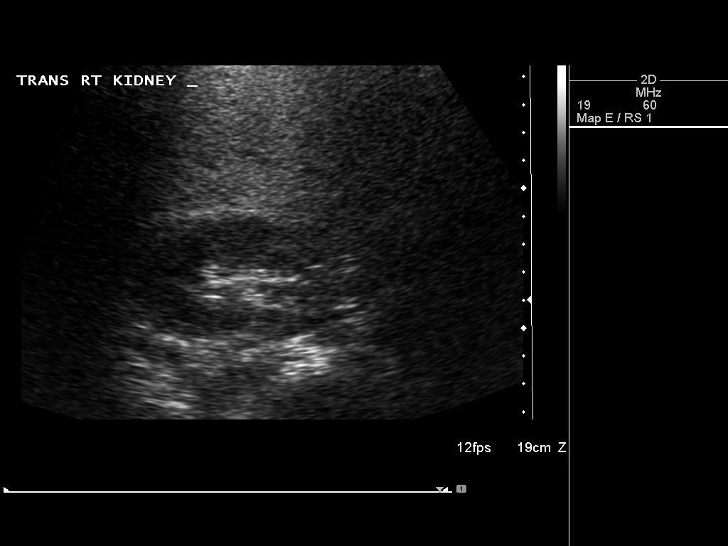
[im 54/65]
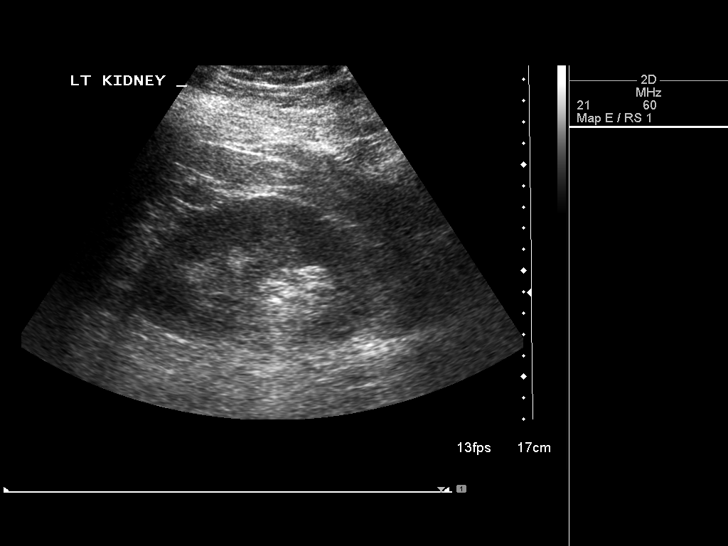
[im 59/65]
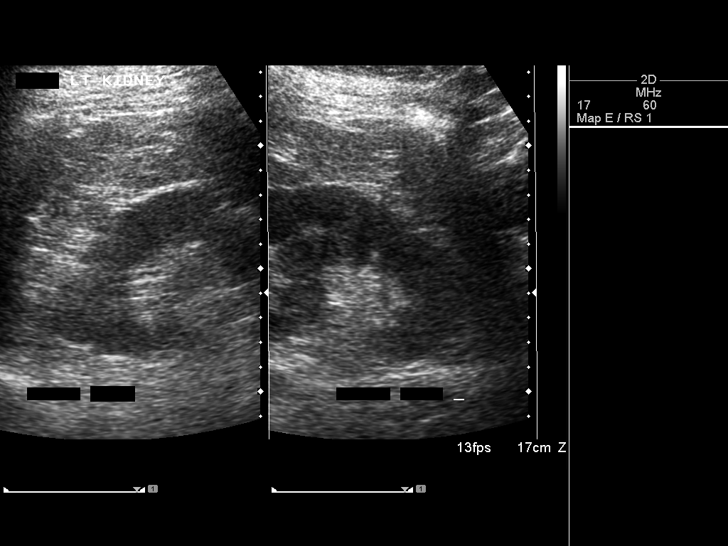
[im 65/65]
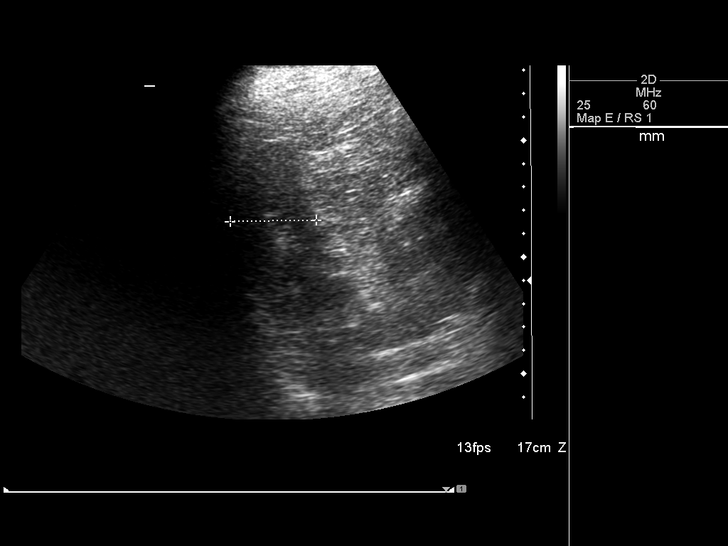

[14 of 25 positions shown; findings below may reference images not displayed]

FINDINGS: Gallbladder: No gallstones or wall thickening visualized. No
sonographic Murphy sign noted.

Common bile duct: Diameter: 3.7 mm in diameter within normal limits

Liver: No focal lesion identified. Within normal limits in
parenchymal echogenicity.

IVC: No abnormality visualized.

Pancreas: Visualized portion unremarkable.

Spleen: Size and appearance within normal limits. Measures 3.7 cm in
length.

Right Kidney: Length: 12.4 cm. Echogenicity within normal limits. No
mass or hydronephrosis visualized.

Left Kidney: Length: 11.6 cm. Echogenicity within normal limits. No
mass or hydronephrosis visualized.

Abdominal aorta: No aneurysm visualized. Measures up to 1.8 cm in
diameter.

Other findings: None.
IMPRESSION: Normal abdominal ultrasound.

## 2017-08-30 IMAGING — DX DG CHEST 2V
2 series · 2 of 2 positions shown · non-contrast
Comparison: Chest x-ray of 01/03/2014

CLINICAL DATA: Hives for 3 weeks, history of allergies, itching

EXAM:
CHEST  2 VIEW

[chest pa]
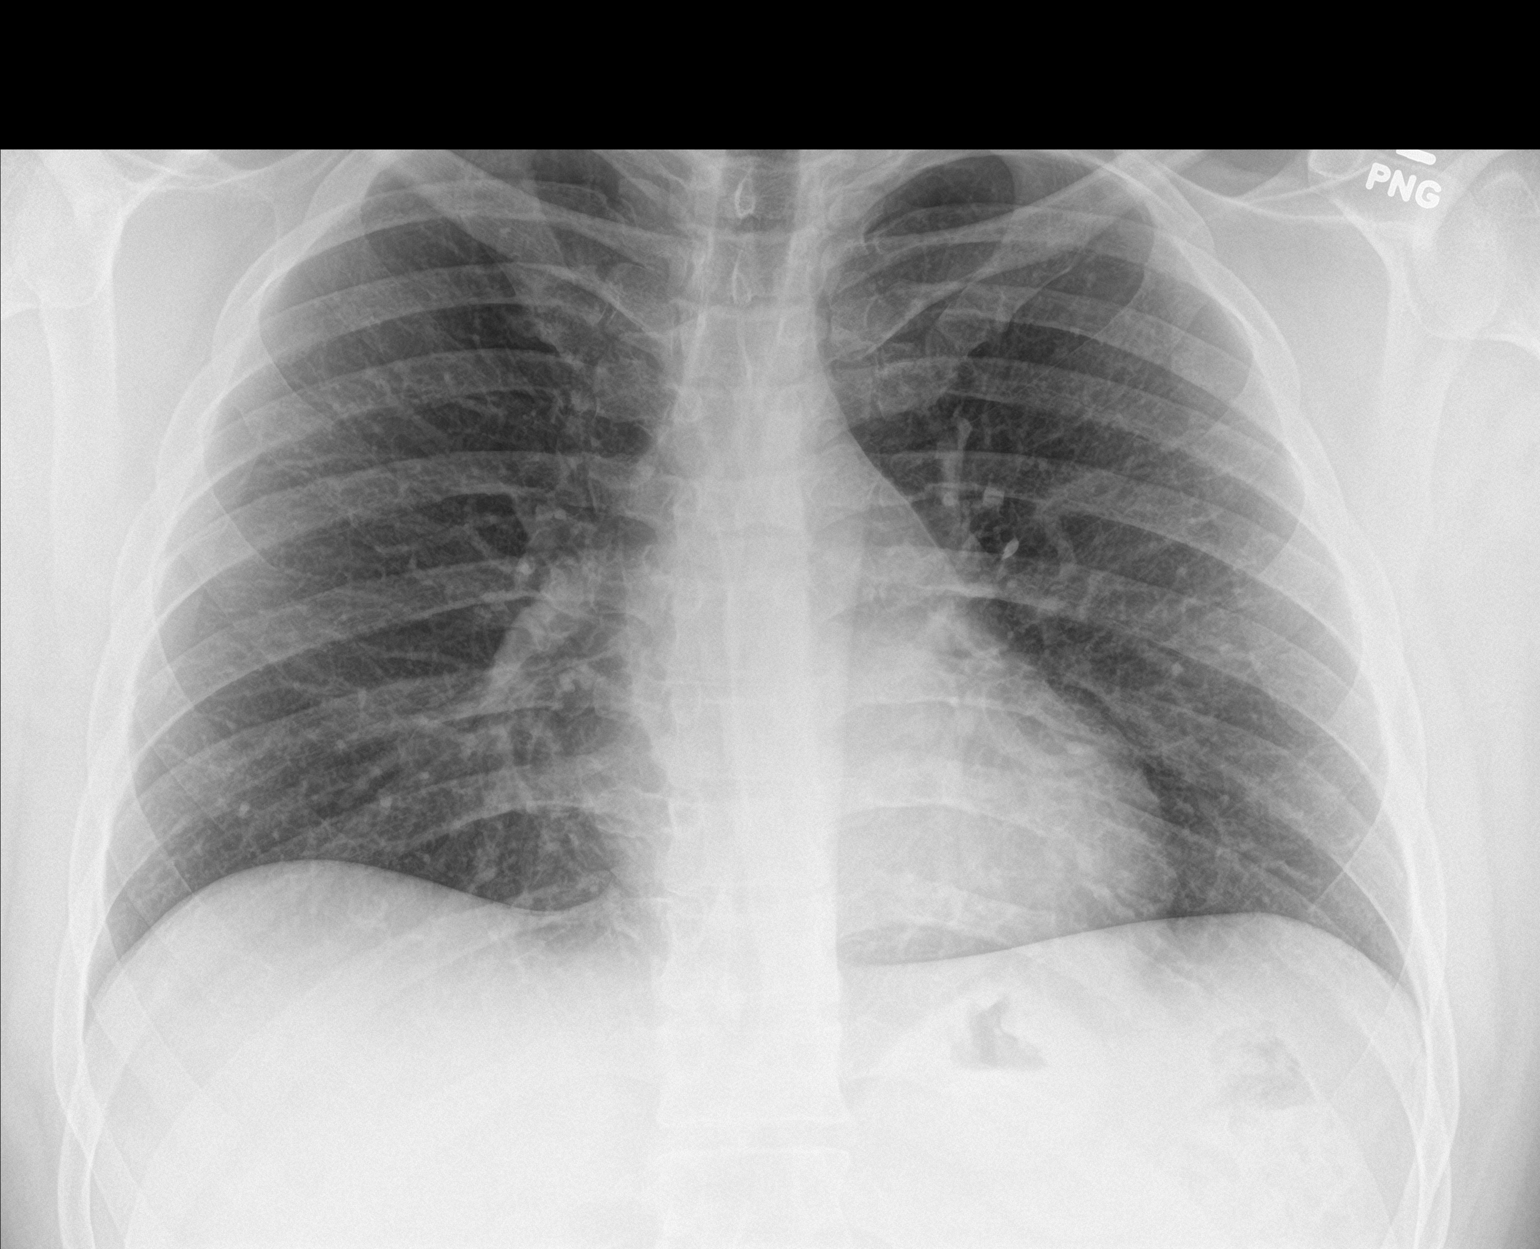

[chest lat]
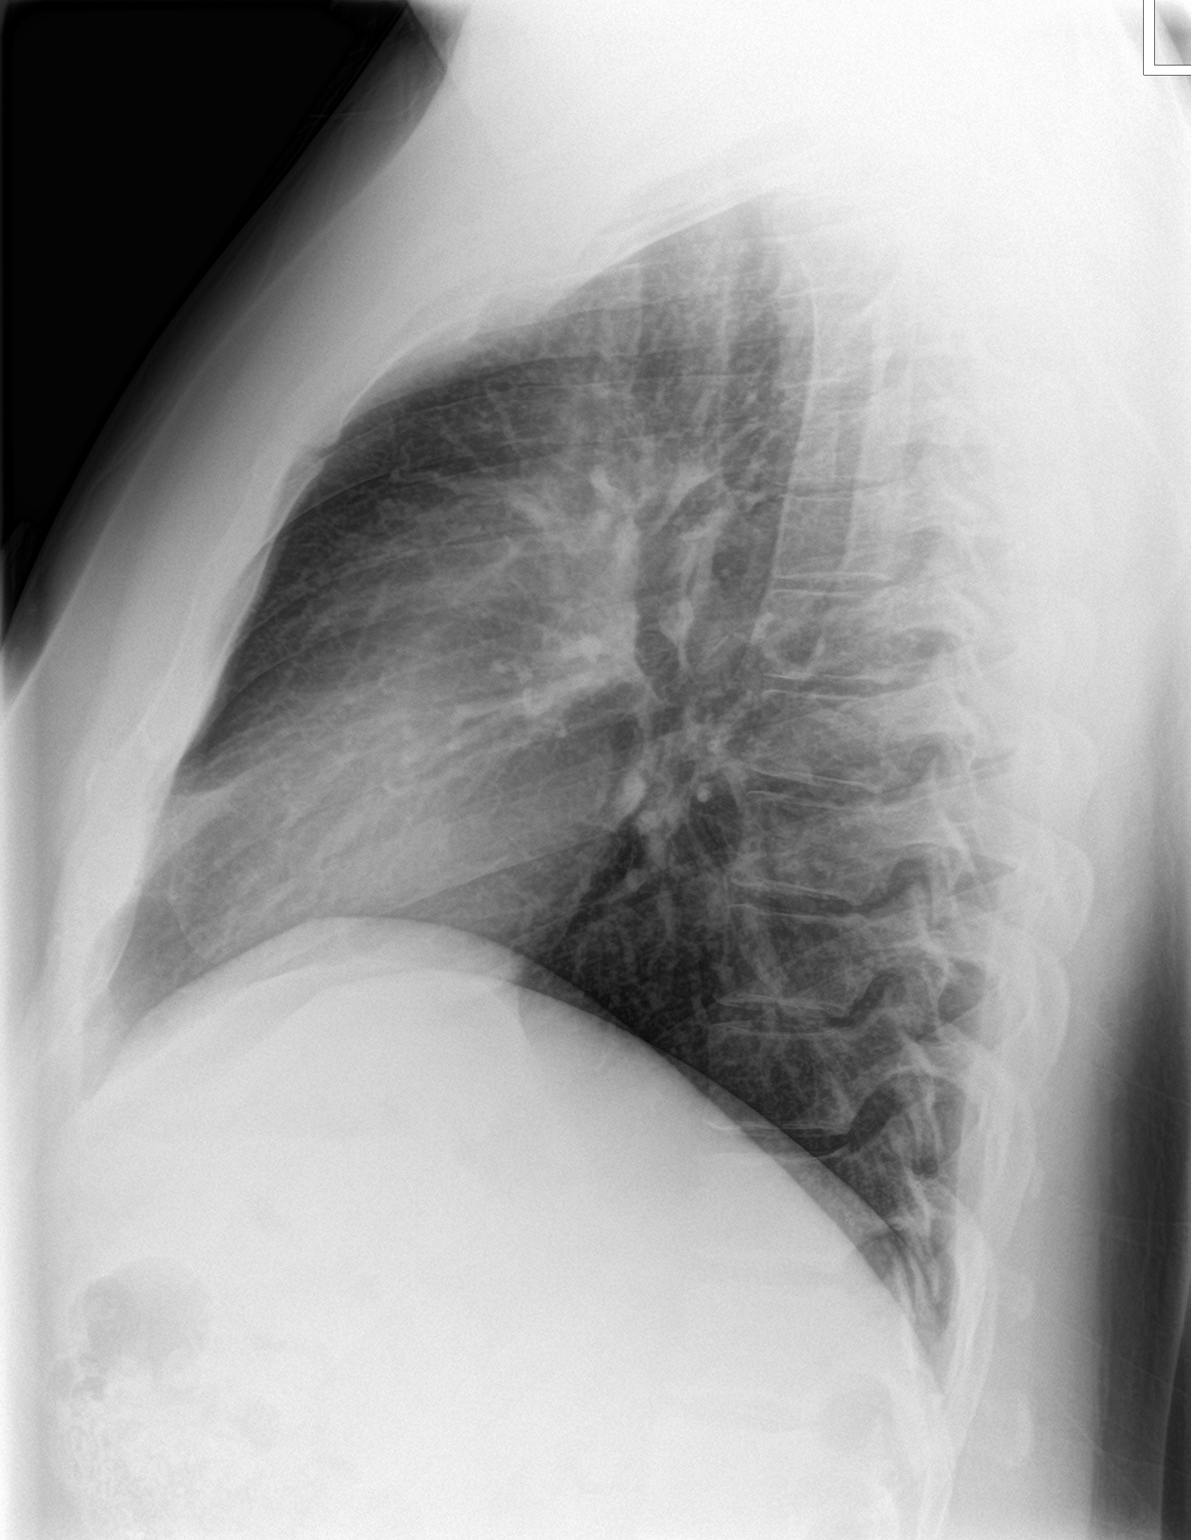

[2 of 2 positions shown; findings below may reference images not displayed]

FINDINGS: No active infiltrate or effusion is seen. There is no evidence of
mediastinal or hilar adenopathy. The heart is within normal limits
in size. No bony abnormality is seen.
IMPRESSION: No active cardiopulmonary disease.

## 2018-04-28 NOTE — Telephone Encounter (Signed)
Error

## 2018-05-05 DIAGNOSIS — Z23 Encounter for immunization: Secondary | ICD-10-CM | POA: Diagnosis not present

## 2019-06-21 ENCOUNTER — Ambulatory Visit (INDEPENDENT_AMBULATORY_CARE_PROVIDER_SITE_OTHER): Payer: 59 | Admitting: Internal Medicine

## 2019-06-21 ENCOUNTER — Other Ambulatory Visit: Payer: Self-pay

## 2019-06-21 ENCOUNTER — Encounter: Payer: Self-pay | Admitting: Internal Medicine

## 2019-06-21 ENCOUNTER — Other Ambulatory Visit (INDEPENDENT_AMBULATORY_CARE_PROVIDER_SITE_OTHER): Payer: 59

## 2019-06-21 VITALS — BP 126/82 | HR 75 | Temp 97.4°F | Ht 73.0 in | Wt 257.0 lb

## 2019-06-21 DIAGNOSIS — Z23 Encounter for immunization: Secondary | ICD-10-CM

## 2019-06-21 DIAGNOSIS — Z0000 Encounter for general adult medical examination without abnormal findings: Secondary | ICD-10-CM | POA: Diagnosis not present

## 2019-06-21 DIAGNOSIS — E559 Vitamin D deficiency, unspecified: Secondary | ICD-10-CM

## 2019-06-21 LAB — BASIC METABOLIC PANEL
BUN: 14 mg/dL (ref 6–23)
CO2: 29 mEq/L (ref 19–32)
Calcium: 9.7 mg/dL (ref 8.4–10.5)
Chloride: 101 mEq/L (ref 96–112)
Creatinine, Ser: 0.94 mg/dL (ref 0.40–1.50)
GFR: 87.7 mL/min (ref >60.00)
Glucose, Bld: 90 mg/dL (ref 70–99)
Potassium: 3.9 mEq/L (ref 3.5–5.1)
Sodium: 136 mEq/L (ref 135–145)

## 2019-06-21 LAB — LIPID PANEL
Cholesterol: 196 mg/dL (ref 0–200)
HDL: 31.8 mg/dL — ABNORMAL LOW
NonHDL: 164.33
Total CHOL/HDL Ratio: 6
Triglycerides: 238 mg/dL — ABNORMAL HIGH (ref 0.0–149.0)
VLDL: 47.6 mg/dL — ABNORMAL HIGH (ref 0.0–40.0)

## 2019-06-21 LAB — HEPATIC FUNCTION PANEL
ALT: 28 U/L (ref 0–53)
AST: 21 U/L (ref 0–37)
Albumin: 4.8 g/dL (ref 3.5–5.2)
Alkaline Phosphatase: 73 U/L (ref 39–117)
Bilirubin, Direct: 0.1 mg/dL (ref 0.0–0.3)
Total Bilirubin: 0.4 mg/dL (ref 0.2–1.2)
Total Protein: 7.8 g/dL (ref 6.0–8.3)

## 2019-06-21 LAB — LDL CHOLESTEROL, DIRECT: Direct LDL: 121 mg/dL

## 2019-06-21 LAB — CBC WITH DIFFERENTIAL/PLATELET
Basophils Absolute: 0 K/uL (ref 0.0–0.1)
Basophils Relative: 0.5 % (ref 0.0–3.0)
Eosinophils Absolute: 0.1 K/uL (ref 0.0–0.7)
Eosinophils Relative: 1.6 % (ref 0.0–5.0)
HCT: 44.9 % (ref 39.0–52.0)
Hemoglobin: 15.3 g/dL (ref 13.0–17.0)
Lymphocytes Relative: 39.2 % (ref 12.0–46.0)
Lymphs Abs: 3.6 K/uL (ref 0.7–4.0)
MCHC: 34.1 g/dL (ref 30.0–36.0)
MCV: 88.3 fl (ref 78.0–100.0)
Monocytes Absolute: 0.6 K/uL (ref 0.1–1.0)
Monocytes Relative: 7 % (ref 3.0–12.0)
Neutro Abs: 4.7 K/uL (ref 1.4–7.7)
Neutrophils Relative %: 51.7 % (ref 43.0–77.0)
Platelets: 344 K/uL (ref 150.0–400.0)
RBC: 5.08 Mil/uL (ref 4.22–5.81)
RDW: 12.8 % (ref 11.5–15.5)
WBC: 9.1 K/uL (ref 4.0–10.5)

## 2019-06-21 LAB — TSH: TSH: 1.63 u[IU]/mL (ref 0.35–4.50)

## 2019-06-21 LAB — URINALYSIS, ROUTINE W REFLEX MICROSCOPIC
Bilirubin Urine: NEGATIVE
Ketones, ur: NEGATIVE
Nitrite: NEGATIVE
RBC / HPF: NONE SEEN
Specific Gravity, Urine: >=1.03 — AB (ref 1.000–1.030)
Total Protein, Urine: NEGATIVE
Urine Glucose: NEGATIVE
Urobilinogen, UA: 0.2 (ref 0.0–1.0)
pH: 5.5 (ref 5.0–8.0)

## 2019-06-21 LAB — VITAMIN D 25 HYDROXY (VIT D DEFICIENCY, FRACTURES): VITD: 15.09 ng/mL — ABNORMAL LOW (ref 30.00–100.00)

## 2019-06-21 LAB — PSA: PSA: 0.39 ng/mL (ref 0.10–4.00)

## 2019-06-21 MED ORDER — VITAMIN D3 50 MCG (2000 UT) PO CAPS: 2000.0000 [IU] | ORAL_CAPSULE | Freq: Every day | ORAL | 3 refills | Status: AC

## 2019-06-21 NOTE — Progress Notes (Signed)
Subjective:  Patient ID: Frank [name], male    DOB: 02/15/1976  Age: 43 y.o. MRN: [id number]  CC: No chief complaint on file.   HPI Frank [name] presents for a well exam   Outpatient Medications Prior to Visit  Medication Sig Dispense Refill  . cetirizine (ZYRTEC) 10 MG tablet Take 1 tablet (10 mg total) by mouth daily. 100 tablet 3  . clotrimazole-betamethasone (LOTRISONE) lotion Apply topically 2 (two) times daily. 30 mL 1  . EPINEPHrine 0.3 mg/0.3 mL IJ SOAJ injection Inject 0.3 mLs (0.3 mg total) into the muscle once. 2 Device 2  . hydrOXYzine (ATARAX/VISTARIL) 50 MG tablet Take 1-2 tablets (50-100 mg total) by mouth 3 (three) times daily as needed for itching. 60 tablet 1  . mometasone (ELOCON) 0.1 % cream Use bid 50 g 2  . montelukast (SINGULAIR) 10 MG tablet Take 1 tablet (10 mg total) by mouth daily. 30 tablet 11   No facility-administered medications prior to visit.    ROS: Review of Systems  Constitutional: Negative for appetite change, fatigue and unexpected weight change.  HENT: Negative for congestion, nosebleeds, sneezing, sore throat and trouble swallowing.   Eyes: Negative for itching and visual disturbance.  Respiratory: Negative for cough.   Cardiovascular: Negative for chest pain, palpitations and leg swelling.  Gastrointestinal: Negative for abdominal distention, blood in stool, diarrhea and nausea.  Genitourinary: Negative for frequency and hematuria.  Musculoskeletal: Positive for back pain. Negative for gait problem, joint swelling and neck pain.  Skin: Negative for rash.  Neurological: Negative for dizziness, tremors, speech difficulty and weakness.  Psychiatric/Behavioral: Negative for agitation, dysphoric mood, sleep disturbance and suicidal ideas. The patient is not nervous/anxious.     Objective:  BP 126/82 (BP Location: Left Arm, Patient Position: Sitting, Cuff Size: Large)   Pulse 75   Temp (!) 97.4 F (36.3 C) (Oral)   Ht 6\' 1"  (1.854  m)   Wt 257 lb (116.6 kg)   SpO2 98%   BMI 33.91 kg/m   BP Readings from Last 3 Encounters:  06/21/19 126/82  06/18/16 118/70  12/04/15 118/72    Wt Readings from Last 3 Encounters:  06/21/19 257 lb (116.6 kg)  06/18/16 262 lb (118.8 kg)  12/04/15 256 lb (116.1 kg)    Physical Exam Constitutional:      General: He is not in acute distress.    Appearance: He is well-developed.     Comments: NAD  Eyes:     Conjunctiva/sclera: Conjunctivae normal.     Pupils: Pupils are equal, round, and reactive to light.  Neck:     Thyroid: No thyromegaly.     Vascular: No JVD.  Cardiovascular:     Rate and Rhythm: Normal rate and regular rhythm.     Heart sounds: Normal heart sounds. No murmur. No friction rub. No gallop.   Pulmonary:     Effort: Pulmonary effort is normal. No respiratory distress.     Breath sounds: Normal breath sounds. No wheezing or rales.  Chest:     Chest wall: No tenderness.  Abdominal:     General: Bowel sounds are normal. There is no distension.     Palpations: Abdomen is soft. There is no mass.     Tenderness: There is no abdominal tenderness. There is no guarding or rebound.  Musculoskeletal:        General: No tenderness. Normal range of motion.     Cervical back: Normal range of motion.  Lymphadenopathy:  Cervical: No cervical adenopathy.  Skin:    General: Skin is warm and dry.     Findings: No rash.  Neurological:     Mental Status: He is alert and oriented to person, place, and time.     Cranial Nerves: No cranial nerve deficit.     Motor: No abnormal muscle tone.     Coordination: Coordination normal.     Gait: Gait normal.     Deep Tendon Reflexes: Reflexes are normal and symmetric.  Psychiatric:        Behavior: Behavior normal.        Thought Content: Thought content normal.        Judgment: Judgment normal.   testes - self exam  Lab Results  Component Value Date   WBC 7.4 04/10/2015   HGB 15.0 04/10/2015   HCT 43.3 04/10/2015     PLT 323.0 04/10/2015   GLUCOSE 97 04/10/2015   CHOL 148 05/25/2013   TRIG 164.0 (H) 05/25/2013   HDL 28.00 (L) 05/25/2013   LDLDIRECT 102.8 09/07/2010   LDLCALC 87 05/25/2013   ALT 30 08/13/2015   AST 23 08/13/2015   NA 139 04/10/2015   K 4.1 04/10/2015   CL 105 04/10/2015   CREATININE 0.85 04/10/2015   BUN 15 04/10/2015   CO2 27 04/10/2015   TSH 1.810 08/13/2015    DG Chest 2 View  Result Date: 06/10/2015 CLINICAL DATA:  Hives for 3 weeks, history of allergies, itching EXAM: CHEST  2 VIEW COMPARISON:  Chest x-ray of 01/03/2014 FINDINGS: No active infiltrate or effusion is seen. There is no evidence of mediastinal or hilar adenopathy. The heart is within normal limits in size. No bony abnormality is seen. IMPRESSION: No active cardiopulmonary disease. Electronically Signed   By: Ivar Drape M.D.   On: 06/10/2015 10:20    Assessment & Plan:   There are no diagnoses linked to this encounter.   No orders of the defined types were placed in this encounter.    Follow-up: No follow-ups on file.  Walker Kehr, MD

## 2019-06-21 NOTE — Assessment & Plan Note (Signed)
Vit D - re-start Labs

## 2019-06-21 NOTE — Patient Instructions (Addendum)

## 2019-06-21 NOTE — Assessment & Plan Note (Signed)
We discussed age appropriate health related issues, including available/recomended screening tests and vaccinations. We discussed a need for adhering to healthy diet and exercise. Labs were ordered to be later reviewed . All questions were answered.   

## 2019-06-23 ENCOUNTER — Other Ambulatory Visit: Payer: Self-pay | Admitting: Internal Medicine

## 2019-06-23 MED ORDER — VITAMIN D3 1.25 MG (50000 UT) PO CAPS: 1.0000 | ORAL_CAPSULE | ORAL | 0 refills | Status: AC

## 2019-08-31 ENCOUNTER — Telehealth: Payer: Self-pay | Admitting: Internal Medicine

## 2019-08-31 NOTE — Telephone Encounter (Signed)
Patient is requesting a refill on the following medication.  Cholecalciferol (VITAMIN D3) 50 MCG (2000 UT) capsule Pharmacy on file.

## 2019-08-31 NOTE — Telephone Encounter (Signed)
Pt notified that this is an OTC medication and no RX is needed
